# Patient Record
Sex: Male | Born: 1960 | Hispanic: No | Marital: Married | Smoking: Never smoker
Health system: Southern US, Community
[De-identification: ages and names within clinical notes are randomized; demographics above are authoritative.]

## PROBLEM LIST (undated history)

## (undated) DIAGNOSIS — E119 Type 2 diabetes mellitus without complications: Secondary | ICD-10-CM

## (undated) DIAGNOSIS — T7840XA Allergy, unspecified, initial encounter: Secondary | ICD-10-CM

## (undated) DIAGNOSIS — I10 Essential (primary) hypertension: Secondary | ICD-10-CM

## (undated) DIAGNOSIS — E785 Hyperlipidemia, unspecified: Secondary | ICD-10-CM

## (undated) HISTORY — DX: Essential (primary) hypertension: I10

## (undated) HISTORY — DX: Allergy, unspecified, initial encounter: T78.40XA

## (undated) HISTORY — DX: Type 2 diabetes mellitus without complications: E11.9

## (undated) HISTORY — DX: Hyperlipidemia, unspecified: E78.5

---

## 2007-06-24 ENCOUNTER — Emergency Department (HOSPITAL_COMMUNITY): Admission: EM | Admit: 2007-06-24 | Discharge: 2007-06-24 | Payer: Self-pay | Admitting: Emergency Medicine

## 2015-05-16 ENCOUNTER — Encounter: Payer: Self-pay | Admitting: Osteopathic Medicine

## 2015-05-16 ENCOUNTER — Ambulatory Visit (INDEPENDENT_AMBULATORY_CARE_PROVIDER_SITE_OTHER): Payer: BC Managed Care – PPO | Admitting: Osteopathic Medicine

## 2015-05-16 VITALS — BP 137/97 | HR 68 | Wt 147.0 lb

## 2015-05-16 DIAGNOSIS — Z1159 Encounter for screening for other viral diseases: Secondary | ICD-10-CM

## 2015-05-16 DIAGNOSIS — E119 Type 2 diabetes mellitus without complications: Secondary | ICD-10-CM | POA: Diagnosis not present

## 2015-05-16 DIAGNOSIS — Z79899 Other long term (current) drug therapy: Secondary | ICD-10-CM

## 2015-05-16 DIAGNOSIS — R5383 Other fatigue: Secondary | ICD-10-CM | POA: Diagnosis not present

## 2015-05-16 DIAGNOSIS — Z114 Encounter for screening for human immunodeficiency virus [HIV]: Secondary | ICD-10-CM

## 2015-05-16 DIAGNOSIS — E785 Hyperlipidemia, unspecified: Secondary | ICD-10-CM | POA: Insufficient documentation

## 2015-05-16 DIAGNOSIS — D751 Secondary polycythemia: Secondary | ICD-10-CM | POA: Diagnosis not present

## 2015-05-16 DIAGNOSIS — N529 Male erectile dysfunction, unspecified: Secondary | ICD-10-CM | POA: Insufficient documentation

## 2015-05-16 DIAGNOSIS — I10 Essential (primary) hypertension: Secondary | ICD-10-CM

## 2015-05-16 DIAGNOSIS — E559 Vitamin D deficiency, unspecified: Secondary | ICD-10-CM

## 2015-05-16 LAB — POCT GLYCOSYLATED HEMOGLOBIN (HGB A1C): HEMOGLOBIN A1C: 6.8

## 2015-05-16 LAB — CBC WITH DIFFERENTIAL/PLATELET
BASOS PCT: 1 % (ref 0–1)
Basophils Absolute: 0.1 10*3/uL (ref 0.0–0.1)
EOS ABS: 0.2 10*3/uL (ref 0.0–0.7)
EOS PCT: 3 % (ref 0–5)
HCT: 47.6 % (ref 39.0–52.0)
HEMOGLOBIN: 16.3 g/dL (ref 13.0–17.0)
Lymphocytes Relative: 37 % (ref 12–46)
Lymphs Abs: 2.9 10*3/uL (ref 0.7–4.0)
MCH: 30.9 pg (ref 26.0–34.0)
MCHC: 34.2 g/dL (ref 30.0–36.0)
MCV: 90.2 fL (ref 78.0–100.0)
MONO ABS: 0.8 10*3/uL (ref 0.1–1.0)
MONOS PCT: 10 % (ref 3–12)
MPV: 10.6 fL (ref 8.6–12.4)
NEUTROS ABS: 3.8 10*3/uL (ref 1.7–7.7)
Neutrophils Relative %: 49 % (ref 43–77)
Platelets: 297 10*3/uL (ref 150–400)
RBC: 5.28 MIL/uL (ref 4.22–5.81)
RDW: 14.1 % (ref 11.5–15.5)
WBC: 7.8 10*3/uL (ref 4.0–10.5)

## 2015-05-16 LAB — TSH: TSH: 1.397 u[IU]/mL (ref 0.350–4.500)

## 2015-05-16 LAB — COMPLETE METABOLIC PANEL WITH GFR
ALBUMIN: 4.5 g/dL (ref 3.6–5.1)
ALK PHOS: 75 U/L (ref 40–115)
ALT: 27 U/L (ref 9–46)
AST: 21 U/L (ref 10–35)
BUN: 10 mg/dL (ref 7–25)
CHLORIDE: 100 mmol/L (ref 98–110)
CO2: 29 mmol/L (ref 20–31)
Calcium: 9.9 mg/dL (ref 8.6–10.3)
Creat: 0.74 mg/dL (ref 0.70–1.33)
GFR, Est African American: >89 mL/min (ref >=60)
GLUCOSE: 123 mg/dL — AB (ref 65–99)
POTASSIUM: 5.2 mmol/L (ref 3.5–5.3)
SODIUM: 137 mmol/L (ref 135–146)
Total Bilirubin: 0.6 mg/dL (ref 0.2–1.2)
Total Protein: 8 g/dL (ref 6.1–8.1)

## 2015-05-16 LAB — LIPID PANEL
Cholesterol: 168 mg/dL (ref 125–200)
HDL: 49 mg/dL (ref >=40)
LDL CALC: 66 mg/dL (ref <130)
TRIGLYCERIDES: 264 mg/dL — AB (ref <150)
Total CHOL/HDL Ratio: 3.4 Ratio (ref <=5.0)
VLDL: 53 mg/dL — ABNORMAL HIGH (ref <30)

## 2015-05-16 MED ORDER — ASPIRIN EC 81 MG PO TBEC: 81.0000 mg | DELAYED_RELEASE_TABLET | Freq: Every day | ORAL | Status: DC

## 2015-05-16 MED ORDER — LISINOPRIL 5 MG PO TABS: 5.0000 mg | ORAL_TABLET | Freq: Every day | ORAL | Status: DC

## 2015-05-16 MED ORDER — METFORMIN HCL ER (MOD) 1000 MG PO TB24
1000.0000 mg | ORAL_TABLET | Freq: Every day | ORAL | Status: DC
Start: 2015-05-16 — End: 2015-05-25

## 2015-05-16 MED ORDER — TADALAFIL 10 MG PO TABS: 10.0000 mg | ORAL_TABLET | Freq: Every day | ORAL | Status: DC | PRN

## 2015-05-16 NOTE — Patient Instructions (Signed)
Medicines you need to be taking: Metformin 1000 mg twice per day, you can finish the 500 mg pills you have Aspirin 81 mg daily Crestor daily - we may need to increase the dose, we will wait for the blood test  Vitamin D once per week Lisinopril 5 mg daily - for blood pressure  You will need to come to the office for a blood pressure check with a nurse in one week.   You will need to come back for a doctor appointment in 3 months. At that visit, we will do a foot exam, make sure you have all your vaccines/shots needed.   We will call you about the blood test results this week.   Thank you!

## 2015-05-16 NOTE — Progress Notes (Signed)
HPI: Nicholas Salazar is a 54 y.o. male who presents to [place]  today for chief complaint of:  Chief Complaint  Patient presents with  . Establish Care    CHOLESTEROL - says had high cholesterol once but the n was ok, is DM2 so on statin. Had a checkup in [place] 01/2015 and Crestor was reduced, he states bloodwork was done there and normal.   DM2 - see below for preventive care. Pt on Metformin. A1C today. Only takes Metformin once per day.   FATIGUE - feels like he is sleeping too much, has been going on 2 - 3 months. Tired overall. No daytime somnolence.   HTN - patient reports the bottom on his blood pressure routinely over 90 when he checks this, no chest pain pressure palpitation, no shortness of breath or exercise tolerance changes in  Care Everywhere records show should be on ASA 81, Metformin 500 bid though he only takes this daily, Zantac 150 bid not taking, Crestor 20 daily but he is taking 5 mg daily at instruction of [place] doctor, Viagra 100mg  prn not taking because too expensice   Past medical, social and family history reviewed: No past medical history on file. No past surgical history on file. Social History  Substance Use Topics  . Smoking status: Not on file  . Smokeless tobacco: Not on file  . Alcohol Use: Not on file   No family history on file.  Current Outpatient Prescriptions  Medication Sig Dispense Refill  . metFORMIN (GLUCOPHAGE) 500 MG tablet Take 500 mg by mouth.    . rosuvastatin (CRESTOR) 5 MG tablet Take 5 mg by mouth daily.    . Vitamin D, Ergocalciferol, (DRISDOL) 50000 UNITS CAPS capsule Take by mouth.     No current facility-administered medications for this visit.   Allergies not on file    Review of Systems: CONSTITUTIONAL: Neg fever/chills, no unintentional weight changes, (+) fatigue/weakness HEAD/EYES/EARS/NOSE/THROAT: No headache/vision change or hearing change, no sore throat CARDIAC: No chest  pain/pressure/palpitations, no orthopnea RESPIRATORY: No cough/shortness of breath/wheeze GASTROINTESTINAL: No nausea/vomiting/abdominal pain/blood in stool/diarrhea/constipation MUSCULOSKELETAL: No myalgia/arthralgia GENITOURINARY: No incontinence, No abnormal genital bleeding/discharge SKIN: No rash/wounds/concerning lesions HEM/ONC: No easy bruising/bleeding, no abnormal lymph node ENDOCRINE: No polyuria/polydipsia/polyphagia, no heat/cold intolerance  NEUROLOGIC: No weakness/dizzines/slurred speech PSYCHIATRIC: No concerns with anxiety, (+) depression/sleep problems    Exam:  BP 137/97 mmHg  Pulse 68  Wt 147 lb (66.679 kg)  SpO2 99% Constitutional: VSS, see above. General Appearance: alert, well-developed, well-nourished, NAD Eyes: Normal lids and conjunctive, non-icteric sclera, PERRLA Ears, Nose, Mouth, Throat: Normal external inspection ears/nares/mouth/lips/gums, Normal TM bilaterally, MMM, posterior pharynx without erythema/exudate Neck: No masses, trachea midline. No thyroid enlargement/tenderness/mass appreciated Respiratory: Normal respiratory effort. no wheeze/rhonchi/rales Cardiovascular: S1/S2 normal, no murmur/rub/gallop auscultated. RRR. No carotid bruit or JVD. No abdominal aortic bruit. Pedal pulse II/IV bilaterally. No lower extremity edema. Gastrointestinal: Nontender, no masses. No hepatomegaly, no splenomegaly. No hernia appreciated. Bowel sounds normal. Rectal exam deferred.  Musculoskeletal: Gait normal. No clubbing/cyanosis of digits.  Neurological: No cranial nerve deficit on limited exam. Motor and sensation intact and symmetric Psychiatric: Normal judgment/insight. Normal mood and affect. Oriented x3.    Results for orders placed or performed in visit on 05/16/15 (from the past 72 hour(s))  POCT HgB A1C     Status: None   Collection Time: 05/16/15  9:40 AM  Result Value Ref Range   Hemoglobin A1C 6.8     Care Everywhere reviewed: pertinent info  as  follows Labs 09/2014 A1C 6.6 VitD 18 GFR WNL Urine Microalb WNL Hgb 16.9 LDL 32 Records indicate need pneumovax   ASSESSMENT/PLAN:  Diabetes mellitus without complication ([place]) - Plan: POCT HgB A1C, metFORMIN (GLUMETZA) 1000 MG (MOD) 24 hr tablet, aspirin EC 81 MG tablet  Hyperlipidemia - Plan: Lipid panel  Other fatigue - Plan: CBC with Differential/Platelet, COMPLETE METABOLIC PANEL WITH GFR, TSH  Polycythemia - Plan: CBC with Differential/Platelet  Essential hypertension - Plan: lisinopril (PRINIVIL,ZESTRIL) 5 MG tablet  Vitamin D deficiency - Plan: Vitamin D (25 hydroxy)  Encounter for screening for HIV - Plan: HIV antibody  Need for hepatitis C screening test - Plan: Hepatitis C antibody  Erectile dysfunction, unspecified erectile dysfunction type - savings card and printed Rx given for Cialis  Patient advised medication changes as follows: Needs to be on daily aspirin for heart health and ASCVD risk reduction. Needs to be taking metformin twice a day but for feeding and will switch thousand milligrams extended release, recheck A1c in 3 months. Continue Crestor 5 mg for now I advised may need to increase the dose of labwork. Patient is educated that main purpose of statin is to be on an appropriate dose to reduce ASCVD risk, he is currently on low dose and may need to go up on this. Not sure what the guidelines are in [place]. Start lisinopril low-dose for blood pressure control and diabetic preventive care.  DIABETES SCREENING/PREVENTIVE CARE: A1C past 3-6 mos: Yes  controlled? Yes  BP goal <140/90: No - started ACEI today  LDL goal <70: Yes  Eye exam annually: No, importance discussed with patient Foot exam: No  Microalbuminuria:WNL Metformin: Yes  ACE/ARB: No  Antiplatelet if ASCVD Risk >10%: No, started this today Statin: Yes but low dose  Next visit: foot exam, update vaccines, A1C

## 2015-05-17 LAB — VITAMIN D 25 HYDROXY (VIT D DEFICIENCY, FRACTURES): Vit D, 25-Hydroxy: 45 ng/mL (ref 30–100)

## 2015-05-17 LAB — HEPATITIS C ANTIBODY: HCV AB: NEGATIVE

## 2015-05-17 LAB — HIV ANTIBODY (ROUTINE TESTING W REFLEX): HIV 1&2 Ab, 4th Generation: NONREACTIVE

## 2015-05-17 MED ORDER — ROSUVASTATIN CALCIUM 20 MG PO TABS: 20.0000 mg | ORAL_TABLET | Freq: Every day | ORAL | Status: DC

## 2015-05-17 NOTE — Addendum Note (Signed)
Addended by: Maryla Morrow on: 05/17/2015 08:19 AM   Modules accepted: Orders

## 2015-05-25 ENCOUNTER — Other Ambulatory Visit: Payer: Self-pay | Admitting: *Deleted

## 2015-05-25 MED ORDER — METFORMIN HCL ER (OSM) 1000 MG PO TB24: 1000.0000 mg | ORAL_TABLET | Freq: Every day | ORAL | Status: DC

## 2015-07-04 ENCOUNTER — Telehealth: Payer: Self-pay

## 2015-07-04 DIAGNOSIS — I10 Essential (primary) hypertension: Secondary | ICD-10-CM

## 2015-07-04 NOTE — Telephone Encounter (Signed)
My last progress note states that he was only taking metformin 500 mg daily was prescribed for twice a day. If he is keeping track of his blood sugars) 500 twice a day with better, can cut the 1000 and off and do this, please advise him to bring his sugar readings to all of his doctor visits.

## 2015-07-04 NOTE — Telephone Encounter (Signed)
Patient states his blood sugar was controlled better with metformin 500 mg bid instead of metformin 1000 mg once daily. Please advise.

## 2015-07-04 NOTE — Telephone Encounter (Signed)
Left detailed message advising of recommendations.  

## 2015-08-02 NOTE — Telephone Encounter (Signed)
Please advise pt regarding current dosage of metformin.

## 2015-08-04 ENCOUNTER — Ambulatory Visit (INDEPENDENT_AMBULATORY_CARE_PROVIDER_SITE_OTHER): Payer: BC Managed Care – PPO | Admitting: Osteopathic Medicine

## 2015-08-04 ENCOUNTER — Encounter: Payer: Self-pay | Admitting: Osteopathic Medicine

## 2015-08-04 VITALS — BP 142/90 | HR 76 | Ht 67.0 in | Wt 146.0 lb

## 2015-08-04 DIAGNOSIS — I10 Essential (primary) hypertension: Secondary | ICD-10-CM | POA: Diagnosis not present

## 2015-08-04 DIAGNOSIS — E119 Type 2 diabetes mellitus without complications: Secondary | ICD-10-CM | POA: Diagnosis not present

## 2015-08-04 MED ORDER — METFORMIN HCL 500 MG PO TABS: 500.0000 mg | ORAL_TABLET | Freq: Every day | ORAL | Status: DC

## 2015-08-04 NOTE — Patient Instructions (Signed)
KEEP CHECKING YOUR SUGARS EVERY MORNING. YOU CAN TAKE THE METFORMIN 1000 MG IN THE MORNING AND 500 MG AT NIGHT, IF THIS IS BRINGING YOUR SUGARS DOWN, GOOD, IF NOT WE MAY NEED TO GO UP TO METFORMIN 1000 MG IN THE MORNINGS AND AT NIGHT TIME. PLEASE KEEP YOUR NEXT APPOINTMENT AND WE WILL CHECK YOUR LABS TO SEE HOW THE SUGARS ARE DOING OVERALL. PLEASE THINK ABOUT IF YOU WANT TO START A MEDICINE FOR ANXIETY AND WE WILL TALK ABOUT THAT MORE AT YOUR NEXT VISIT. PLEASE CALL THE OFFICE IF YOU HAVE ANY QUESTIONS!

## 2015-08-04 NOTE — Progress Notes (Signed)
HPI: Nicholas Salazar is a 54 y.o. male who presents to [place] today for chief complaint of:  Chief Complaint  Patient presents with  . Follow-up    labs    . DIABETES: Patient is concerned about his sugars going up, he brings sugar logs today. Fasting sugar was 188, 133, 139. Patient reports that he is nervous particularly about the 188 fasting sugar. He is taking metformin extended release 1000 mg daily, I had previously advised that he may need regular metformin 1000 twice a day however he has been resistant to this. He states that when he was on 500 mg twice a day history years were doing better. He asks if he can continue the metformin ER 1000 in the morning and take the 500s at night, I said this is reasonable but will need to see what the A1c is looking like next month. Patient is resistant to adding another oral medication to his regimen that he is motivated to make the necessary diet and exercise efforts  . ANXIETY: Patient reports that he is anxious about his blood sugars, his wife is also ill and passed away earlier this year and he is still dealing with some stress from medical bills. Patient denies feelings of sadness, guilt or worthlessness, he does report some difficulty sleeping and difficulty concentrating.  HYPERTENSION: Patient brings home blood pressure readings with him, diastolics typically in 123XX123, systolic typically in 123XX123 to 120s. Patient states he is not taking the lisinopril. Blood pressure elevated in the office today    Past medical, social and family history reviewed: Past Medical History  Diagnosis Date  . Diabetes mellitus ([place])   . Hyperlipidemia    No past surgical history on file. Social History  Substance Use Topics  . Smoking status: Never Smoker   . Smokeless tobacco: Not on file  . Alcohol Use: 0.6 oz/week    1 Standard drinks or equivalent per week   No family history on file.  Current Outpatient Prescriptions   Medication Sig Dispense Refill  . aspirin EC 81 MG tablet Take 1 tablet (81 mg total) by mouth daily. 90 tablet 3  . metformin (FORTAMET) 1000 MG (OSM) 24 hr tablet Take 1 tablet (1,000 mg total) by mouth daily with breakfast. 90 tablet 3  . rosuvastatin (CRESTOR) 20 MG tablet Take 1 tablet (20 mg total) by mouth daily. 90 tablet 3  . tadalafil (CIALIS) 10 MG tablet Take 1 tablet (10 mg total) by mouth daily as needed for erectile dysfunction. 30 tablet 1  . Vitamin D, Ergocalciferol, (DRISDOL) 50000 UNITS CAPS capsule Take by mouth.    Marland Kitchen lisinopril (PRINIVIL,ZESTRIL) 5 MG tablet Take 1 tablet (5 mg total) by mouth daily. (Patient not taking: Reported on 08/04/2015) 90 tablet 3   No current facility-administered medications for this visit.   No Known Allergies    Review of Systems: CONSTITUTIONAL:  No  fever, no chills, No  unintentional weight changes CARDIAC: No  chest pain, No  pressure RESPIRATORY: No  cough, No  shortness of breath/wheeze GASTROINTESTINAL: No  nausea, No  vomiting, No  abdominal pain, No  blood in stool, No  diarrhea, No  constipation  ENDOCRINE: No  polyuria/polydipsia/polyphagia, No  heat/cold intolerance  NEUROLOGIC: No  weakness, No  dizziness, No  slurred speech PSYCHIATRIC: No  concerns with depression, Yes  concerns with anxiety, No sleep problems     Exam:  BP 142/90 mmHg  Pulse 76  Ht 5'  7" (1.702 m)  Wt 146 lb (66.225 kg)  BMI 22.86 kg/m2 Constitutional: VS see above. General Appearance: alert, well-developed, well-nourished, NAD Psychiatric: Normal judgment/insight. Normal mood and affect. Oriented x3.    No results found for this or any previous visit (from the past 72 hour(s)).    ASSESSMENT/PLAN: Advised patient he is fine to take the metformin 1000 in morning and 500 at night, however if this is not effecting any improvement in his A1c we may need to increase the dose or think about adding a second oral agent. She was advised to bring his  home blood pressure cuff to his next visit so that we can verify his measurements since his blood pressure is a bit elevated in the office today compared to his home readings. Patient also reluctant to start any medication treatment for anxiety/depression, he says he will think about doing this and we can talk about it more and his next visit.  Diabetes mellitus without complication ([place])  Essential hypertension     Return if symptoms worsen or fail to improve. patient also instructed to keep his next appointment 08/19/2015

## 2015-08-19 ENCOUNTER — Ambulatory Visit (INDEPENDENT_AMBULATORY_CARE_PROVIDER_SITE_OTHER): Payer: BC Managed Care – PPO | Admitting: Osteopathic Medicine

## 2015-08-19 ENCOUNTER — Encounter: Payer: Self-pay | Admitting: Osteopathic Medicine

## 2015-08-19 VITALS — BP 131/84 | HR 77 | Ht 67.0 in | Wt 145.0 lb

## 2015-08-19 DIAGNOSIS — I10 Essential (primary) hypertension: Secondary | ICD-10-CM

## 2015-08-19 DIAGNOSIS — Z2821 Immunization not carried out because of patient refusal: Secondary | ICD-10-CM | POA: Diagnosis not present

## 2015-08-19 DIAGNOSIS — F411 Generalized anxiety disorder: Secondary | ICD-10-CM | POA: Insufficient documentation

## 2015-08-19 DIAGNOSIS — E119 Type 2 diabetes mellitus without complications: Secondary | ICD-10-CM

## 2015-08-19 DIAGNOSIS — Z79899 Other long term (current) drug therapy: Secondary | ICD-10-CM | POA: Diagnosis not present

## 2015-08-19 LAB — POCT GLYCOSYLATED HEMOGLOBIN (HGB A1C): HEMOGLOBIN A1C: 6.8

## 2015-08-19 NOTE — Progress Notes (Signed)
HPI: Nicholas Salazar is a 55 y.o. male who presents to [facility] today for chief complaint of:  Chief Complaint  Patient presents with  . Follow-up    diabetes    . DIABETES: Pt came in a few weeks ago concerned about his blood sugars going up. Fasting sugar was 188, 133, 139. He is taking metformin extended release 1000 mg daily, I had previously advised that he may need regular metformin 1000 twice a day however he has been resistant to this. He states that when he was on 500 mg twice a day history years were doing better. He asks if he can continue the metformin ER 1000 in the morning and take the 500s at night, I said this is reasonable. Patient is resistant to increasing metformin to standard dose but he is motivated to make the necessary diet and exercise efforts. A1C today is same.   Marland Kitchen ANXIETY: Patient also reluctant to start any medication treatment for anxiety/depression, he says he will think about doing this and we can talk about it more and his next visit.   HYPERTENSION: Patient brings home blood pressure readings with him, diastolics typically in 123XX123, systolic typically in 123XX123 to 120s. Patient states he is not taking the lisinopril. Blood pressure better in the office today than at last visit. He didn't bring his home BP cuff today.    DIABETES SCREENING/PREVENTIVE CARE: A1C past 3-6 mos: Yes  controlled? Yes though ideally would be at 6.5 given his lack of significant disease, was 6.8 on 05/16/15, same today BP goal <140/90: Yes  LDL goal <70: Yes  Eye exam annually: Yes , importance discussed with patient Foot exam: Yes  Microalbuminuria:No  Metformin: Yes  ACE/ARB: Prescribed but not taking Antiplatelet if ASCVD Risk >10%: Yes  Statin: Yes  Pneumovax: DECLINED Flu vaccine: DECLINED    Past medical, social and family history reviewed: Past Medical History  Diagnosis Date  . Diabetes mellitus ([place])   . Hyperlipidemia    No past  surgical history on file. Social History  Substance Use Topics  . Smoking status: Never Smoker   . Smokeless tobacco: Not on file  . Alcohol Use: 0.6 oz/week    1 Standard drinks or equivalent per week   No family history on file.  Current Outpatient Prescriptions  Medication Sig Dispense Refill  . aspirin EC 81 MG tablet Take 1 tablet (81 mg total) by mouth daily. 90 tablet 3  . lisinopril (PRINIVIL,ZESTRIL) 5 MG tablet Take 1 tablet (5 mg total) by mouth daily. (Patient not taking: Reported on 08/04/2015) 90 tablet 3  . metformin (FORTAMET) 1000 MG (OSM) 24 hr tablet Take 1 tablet (1,000 mg total) by mouth daily with breakfast. 90 tablet 3  . metFORMIN (GLUCOPHAGE) 500 MG tablet Take 1 tablet (500 mg total) by mouth at bedtime. 180 tablet 3  . rosuvastatin (CRESTOR) 20 MG tablet Take 1 tablet (20 mg total) by mouth daily. 90 tablet 3  . tadalafil (CIALIS) 10 MG tablet Take 1 tablet (10 mg total) by mouth daily as needed for erectile dysfunction. 30 tablet 1  . Vitamin D, Ergocalciferol, (DRISDOL) 50000 UNITS CAPS capsule Take by mouth.     No current facility-administered medications for this visit.   No Known Allergies    Review of Systems: CONSTITUTIONAL:  No  fever, no chills, No  unintentional weight changes CARDIAC: No  chest pain, No  pressure RESPIRATORY: No  cough, No  shortness of breath/wheeze  GASTROINTESTINAL: No  nausea, No  vomiting, No  abdominal pain, No  blood in stool, No  diarrhea, No  constipation  ENDOCRINE: No  polyuria/polydipsia/polyphagia, No  heat/cold intolerance  PSYCHIATRIC: No  concerns with depression, Yes  concerns with anxiety but doesn't want to be on medications, No sleep problems     Exam:  BP 131/84 mmHg  Pulse 77  Ht 5\' 7"  (1.702 m)  Wt 145 lb (65.772 kg)  BMI 22.71 kg/m2 Constitutional: VS see above. General Appearance: alert, well-developed, well-nourished, NAD Psychiatric: Normal judgment/insight. Normal mood and affect. Oriented x3.   Diabetic Foot Exam - Simple   Simple Foot Form  Diabetic Foot exam was performed with the following findings:  Yes 08/19/2015  9:39 AM  Visual Inspection  No deformities, no ulcerations, no other skin breakdown bilaterally:  Yes  Sensation Testing  Intact to touch and monofilament testing bilaterally:  Yes  Pulse Check  Posterior Tibialis and Dorsalis pulse intact bilaterally:  Yes  Comments        Results for orders placed or performed in visit on 08/19/15 (from the past 72 hour(s))  POCT HgB A1C     Status: None   Collection Time: 08/19/15  9:22 AM  Result Value Ref Range   Hemoglobin A1C 6.8       ASSESSMENT/PLAN: Caught up on foot exam today, pt declines flu and pneumonia vaccines. BP better on no meds.   Diabetes mellitus without complication ([place]) - Plan: POCT HgB A1C  Essential hypertension  Medication management  Pneumococcal vaccination declined  Influenza vaccination declined  Anxiety state   Return in about 3 months (around 11/17/2015) for DIABETES FOLLOW UP.

## 2015-11-04 ENCOUNTER — Telehealth: Payer: Self-pay

## 2015-11-04 DIAGNOSIS — E119 Type 2 diabetes mellitus without complications: Secondary | ICD-10-CM

## 2015-11-04 MED ORDER — METFORMIN HCL 500 MG PO TABS: 500.0000 mg | ORAL_TABLET | Freq: Every day | ORAL | Status: DC

## 2015-11-04 NOTE — Telephone Encounter (Signed)
Patient request refill for Metformin 500 mg. Rx was sent to [facility] and patient have a follow up scheduled for 04/17. Rhonda Cunningham,CMA

## 2015-11-18 ENCOUNTER — Ambulatory Visit (INDEPENDENT_AMBULATORY_CARE_PROVIDER_SITE_OTHER): Payer: BC Managed Care – PPO | Admitting: Osteopathic Medicine

## 2015-11-18 ENCOUNTER — Encounter: Payer: Self-pay | Admitting: Osteopathic Medicine

## 2015-11-18 VITALS — BP 125/85 | HR 63 | Ht 67.0 in | Wt 145.0 lb

## 2015-11-18 DIAGNOSIS — E119 Type 2 diabetes mellitus without complications: Secondary | ICD-10-CM

## 2015-11-18 DIAGNOSIS — S56912A Strain of unspecified muscles, fascia and tendons at forearm level, left arm, initial encounter: Secondary | ICD-10-CM

## 2015-11-18 DIAGNOSIS — M7712 Lateral epicondylitis, left elbow: Secondary | ICD-10-CM

## 2015-11-18 DIAGNOSIS — I10 Essential (primary) hypertension: Secondary | ICD-10-CM | POA: Diagnosis not present

## 2015-11-18 LAB — POCT GLYCOSYLATED HEMOGLOBIN (HGB A1C): HEMOGLOBIN A1C: 6.4

## 2015-11-18 MED ORDER — BLOOD GLUCOSE MONITOR KIT
PACK | Status: AC
Start: 2015-11-18 — End: ?

## 2015-11-18 NOTE — Patient Instructions (Signed)
For your arm pain, try the exercises and stretches outlined below to help with your arm pain, rest it for a few weeks, try the pain medicine that we gave you in the office, you can rub it directly on the skin where the pain is located. If you would like [place] to write a long-term prescription for this medicine, let us know and we will take care of that for you. If your pain gets worse or isn't going away, let us know and we can put in a referral to physical therapy or have you come back to the office to look at it again.  For your diabetes, keep up the good work with the medications and the exercise, your A1c is looking great today. Left plan to follow-up in 6 months to recheck the diabetes and complete your annual wellness exam. If he would like to come in a few days for that appointment, and get your lab work done ahead of time so we can talk about the results at your visit, that would be helpful for you.   If everything is going well, just be sure to keep your follow-up in 6 months. Otherwise, if anything is bothering you, please let us know sooner so that we can help!    RANGE OF MOTION (ROM) AND STRETCHING EXERCISES  These exercises may help you when beginning to rehabilitate your injury. Your symptoms may go away with or without further involvement from your physician, physical therapist, or athletic trainer. While completing these exercises, remember:   Restoring tissue flexibility helps normal motion to return to the joints. This allows healthier, less painful movement and activity.  An effective stretch should be held for at least 30 seconds.  A stretch should never be painful. You should only feel a gentle lengthening or release in the stretched tissue. RANGE OF MOTION - Wrist Flexion, Active-Assisted  Extend your right / left elbow with your fingers pointing down.*  Gently pull the back of your hand towards you, until you feel a gentle stretch on the top of your forearm.  Hold this  position for __________ seconds. Repeat __________ times. Complete this exercise __________ times per day.  *If directed by your physician, physical therapist or athletic trainer, complete this stretch with your elbow bent, rather than extended. RANGE OF MOTION - Wrist Extension, Active-Assisted  Extend your right / left elbow and turn your palm upwards.*  Gently pull your palm and fingertips back, so your wrist extends and your fingers point more toward the ground.  You should feel a gentle stretch on the inside of your forearm.  Hold this position for __________ seconds. Repeat __________ times. Complete this exercise __________ times per day. *If directed by your physician, physical therapist or athletic trainer, complete this stretch with your elbow bent, rather than extended. STRETCH - Wrist Flexion  Place the back of your right / left hand on a tabletop, leaving your elbow slightly bent. Your fingers should point away from your body.  Gently press the back of your hand down onto the table by straightening your elbow. You should feel a stretch on the top of your forearm.  Hold this position for __________ seconds. Repeat __________ times. Complete this stretch __________ times per day.  STRETCH - Wrist Extension   Place your right / left fingertips on a tabletop, leaving your elbow slightly bent. Your fingers should point backwards.  Gently press your fingers and palm down onto the table by straightening your elbow. You should  feel a stretch on the inside of your forearm.  Hold this position for __________ seconds. Repeat __________ times. Complete this stretch __________ times per day.  STRENGTHENING EXERCISES - Epicondylitis, Lateral (Tennis Elbow) These exercises may help you when beginning to rehabilitate your injury. They may resolve your symptoms with or without further involvement from your physician, physical therapist, or athletic trainer. While completing these  exercises, remember:   Muscles can gain both the endurance and the strength needed for everyday activities through controlled exercises.  Complete these exercises as instructed by your physician, physical therapist or athletic trainer. Increase the resistance and repetitions only as guided.  You may experience muscle soreness or fatigue, but the pain or discomfort you are trying to eliminate should never worsen during these exercises. If this pain does get worse, stop and make sure you are following the directions exactly. If the pain is still present after adjustments, discontinue the exercise until you can discuss the trouble with your caregiver. STRENGTH - Wrist Flexors  Sit with your right / left forearm palm-up and fully supported on a table or countertop. Your elbow should be resting below the height of your shoulder. Allow your wrist to extend over the edge of the surface.  Loosely holding a __________ weight, or a piece of rubber exercise band or tubing, slowly curl your hand up toward your forearm.  Hold this position for __________ seconds. Slowly lower the wrist back to the starting position in a controlled manner. Repeat __________ times. Complete this exercise __________ times per day.  STRENGTH - Wrist Extensors  Sit with your right / left forearm palm-down and fully supported on a table or countertop. Your elbow should be resting below the height of your shoulder. Allow your wrist to extend over the edge of the surface.  Loosely holding a __________ weight, or a piece of rubber exercise band or tubing, slowly curl your hand up toward your forearm.  Hold this position for __________ seconds. Slowly lower the wrist back to the starting position in a controlled manner. Repeat __________ times. Complete this exercise __________ times per day.  STRENGTH - Ulnar Deviators  Stand with a ____________________ weight in your right / left hand, or sit while holding a rubber exercise band  or tubing, with your healthy arm supported on a table or countertop.  Move your wrist, so that your pinkie travels toward your forearm and your thumb moves away from your forearm.  Hold this position for __________ seconds and then slowly lower the wrist back to the starting position. Repeat __________ times. Complete this exercise __________ times per day STRENGTH - Radial Deviators  Stand with a ____________________ weight in your right / left hand, or sit while holding a rubber exercise band or tubing, with your injured arm supported on a table or countertop.  Raise your hand upward in front of you or pull up on the rubber tubing.  Hold this position for __________ seconds and then slowly lower the wrist back to the starting position. Repeat __________ times. Complete this exercise __________ times per day. STRENGTH - Forearm Supinators   Sit with your right / left forearm supported on a table, keeping your elbow below shoulder height. Rest your hand over the edge, palm down.  Gently grip a hammer or a soup ladle.  Without moving your elbow, slowly turn your palm and hand upward to a "thumbs-up" position.  Hold this position for __________ seconds. Slowly return to the starting position. Repeat __________ times.  Complete this exercise __________ times per day.  STRENGTH - Forearm Pronators   Sit with your right / left forearm supported on a table, keeping your elbow below shoulder height. Rest your hand over the edge, palm up.  Gently grip a hammer or a soup ladle.  Without moving your elbow, slowly turn your palm and hand upward to a "thumbs-up" position.  Hold this position for __________ seconds. Slowly return to the starting position. Repeat __________ times. Complete this exercise __________ times per day.  STRENGTH - Grip  Grasp a tennis ball, a dense sponge, or a large, rolled sock in your hand.  Squeeze as hard as you can, without increasing any pain.  Hold this  position for __________ seconds. Release your grip slowly. Repeat __________ times. Complete this exercise __________ times per day.  STRENGTH - Elbow Extensors, Isometric  Stand or sit upright, on a firm surface. Place your right / left arm so that your palm faces your stomach, and it is at the height of your waist.  Place your opposite hand on the underside of your forearm. Gently push up as your right / left arm resists. Push as hard as you can with both arms, without causing any pain or movement at your right / left elbow. Hold this stationary position for __________ seconds. Gradually release the tension in both arms. Allow your muscles to relax completely before repeating.   This information is not intended to replace advice given to you by your health care provider. Make sure you discuss any questions you have with your health care provider.   Document Released: 07/30/2005 Document Revised: 08/20/2014 Document Reviewed: 11/11/2008 Elsevier Interactive Patient Education 2016 [employer]    Tendinitis  Tendinitis is redness, soreness, and puffiness (inflammation) of the tendons. Tendons are band-like tissues that connect muscle to bone. Tendinitis often happens in the shoulders, heels, or elbows. It might happen if your job involves doing the same motions over and over. HOME CARE  Use a sling or splint as told by your doctor.  Put ice on the injured area.  Put ice in a plastic bag.  Place a towel between your skin and the bag.  Leave the ice on for 15-20 minutes, 03-04 times a day.  Avoid using your injured arm or leg until the pain goes away.  Do gentle exercises only as told by your doctor. Stop exercises if the pain gets worse, unless your doctor tells you otherwise.  Only take medicines as told by your doctor. GET HELP RIGHT AWAY IF:  Your pain and puffiness get worse.  You have new problems, such as loss of feeling (numbness) in the hands. MAKE SURE  YOU:  Understand these instructions.  Will watch your condition.  Will get help right away if you are not doing well or get worse.   This information is not intended to replace advice given to you by your health care provider. Make sure you discuss any questions you have with your health care provider.   Document Released: 11/09/2010 Document Revised: 10/22/2011 Document Reviewed: 11/09/2010 Elsevier Interactive Patient Education [employer].

## 2015-11-18 NOTE — Progress Notes (Signed)
HPI: Nicholas Salazar is a 55 y.o. male who presents to [facility] today for chief complaint of:  Chief Complaint  Patient presents with  . Follow-up    DIABETES    . DIABETES: Taking the metformin ER 1000 in the morning and take the 500s at night, I said this is reasonable. Patient is resistant to increasing metformin to standard dose but he is motivated to make the necessary diet and exercise efforts. A1C today is same.    MUSCLE PAIN IN L FOREARM: Patient visited worse when he is lifting, located around distal to lateral epicondyle/brachioradialis muscle, worse with working out.    HYPERTENSION: Patient brings home blood pressure readings with him, diastolics typically in 40H, systolic typically in 680S to 120s. Patient states he is not taking the lisinopril. Blood pressure good in the office today than at last visit. He didn't bring his home BP cuff today - He doesn't have a home blood pressure cuff he says that he typically will measure it at [place]: A1C past 3-6 mos: Yes  controlled? Yes - Better at at 6.5 on 11/18/15 given his lack of significant comorbid disease, was 6.8 on 05/16/15, 6.8 on 08/19/15 BP goal <140/90: Yes  LDL goal <70: Yes  Eye exam annually: Yes , importance discussed with patient Foot exam: Yes  Microalbuminuria: No  Metformin: Yes  ACE/ARB: Prescribed but not taking Antiplatelet if ASCVD Risk >10%: Yes  Statin: Yes  Pneumovax: DECLINED Flu vaccine: DECLINED    Past medical, social and family history reviewed: Past Medical History  Diagnosis Date  . Diabetes mellitus ([place])   . Hyperlipidemia    No past surgical history on file. Social History  Substance Use Topics  . Smoking status: Never Smoker   . Smokeless tobacco: Not on file  . Alcohol Use: 0.6 oz/week    1 Standard drinks or equivalent per week   No family history on file.  Current Outpatient Prescriptions   Medication Sig Dispense Refill  . aspirin EC 81 MG tablet Take 1 tablet (81 mg total) by mouth daily. 90 tablet 3  . metformin (FORTAMET) 1000 MG (OSM) 24 hr tablet Take 1 tablet (1,000 mg total) by mouth daily with breakfast. 90 tablet 3  . metFORMIN (GLUCOPHAGE) 500 MG tablet Take 1 tablet (500 mg total) by mouth at bedtime. 180 tablet 3  . rosuvastatin (CRESTOR) 20 MG tablet Take 1 tablet (20 mg total) by mouth daily. 90 tablet 3  . tadalafil (CIALIS) 10 MG tablet Take 1 tablet (10 mg total) by mouth daily as needed for erectile dysfunction. 30 tablet 1   No current facility-administered medications for this visit.   No Known Allergies    Review of Systems: CONSTITUTIONAL:  No  fever, no chills, No  unintentional weight changes CARDIAC: No  chest pain, No  pressure RESPIRATORY: No  cough, No  shortness of breath/wheeze GASTROINTESTINAL: No  nausea, No  vomiting, No  abdominal pain, No  blood in stool, No  diarrhea, No  constipation  ENDOCRINE: No  polyuria/polydipsia/polyphagia, No  heat/cold intolerance  PSYCHIATRIC: No  concerns with depression, Yes  concerns with anxiety but doesn't want to be on medications, No sleep problems     Exam:  BP 125/85 mmHg  Pulse 63  Ht _0  (1.702 m)  Wt 145 lb (65.772 kg)  BMI 22.71 kg/m2 Constitutional: VS see above. General Appearance: alert, well-developed, well-nourished, NAD MUSCULOSKELETAL: mild tenderness L  lateral epicondyle and bracioradialis on extension of wrist CARDIOVASCULAR: S1-S2 normal, regular rate and rhythm, no murmurs rubs or gallops Respiratory: Lungs clear to auscultation bilaterally Psychiatric: Normal judgment/insight. Normal mood and affect. Oriented x3.     Results for orders placed or performed in visit on 11/18/15 (from the past 72 hour(s))  POCT HgB A1C     Status: None   Collection Time: 11/18/15  9:29 AM  Result Value Ref Range   Hemoglobin A1C 6.4   Improved from 6.8% three mos  ago    ASSESSMENT/PLAN: A1C improved. Pt declines flu and pneumonia vaccines. BP controlled on no meds - plan for Urine Microalbumin with recheck other annual screening labs 6 mos. Pennsaid samples for arm pain, stretching/rehab exercises given, advise trial prn OTC NSAID for severe pain.   Diabetes mellitus without complication ([place]) - Plan: POCT HgB A1C, blood glucose meter kit and supplies KIT  Essential hypertension  Lateral epicondylitis (tennis elbow), left  Muscle strain of forearm, left, initial encounter   Return in about 6 months (around 05/19/2016), or sooner if needed, for [place] .

## 2016-01-10 ENCOUNTER — Other Ambulatory Visit: Payer: Self-pay | Admitting: Osteopathic Medicine

## 2016-01-10 DIAGNOSIS — N529 Male erectile dysfunction, unspecified: Secondary | ICD-10-CM

## 2016-01-10 MED ORDER — TADALAFIL 10 MG PO TABS: 10.0000 mg | ORAL_TABLET | Freq: Every day | ORAL | Status: DC | PRN

## 2016-03-13 LAB — HM DIABETES EYE EXAM

## 2016-05-18 ENCOUNTER — Encounter: Payer: Self-pay | Admitting: Osteopathic Medicine

## 2016-05-18 ENCOUNTER — Ambulatory Visit (INDEPENDENT_AMBULATORY_CARE_PROVIDER_SITE_OTHER): Payer: BC Managed Care – PPO | Admitting: Osteopathic Medicine

## 2016-05-18 VITALS — BP 127/89 | HR 86 | Ht 67.0 in | Wt 146.0 lb

## 2016-05-18 DIAGNOSIS — K219 Gastro-esophageal reflux disease without esophagitis: Secondary | ICD-10-CM | POA: Diagnosis not present

## 2016-05-18 DIAGNOSIS — E119 Type 2 diabetes mellitus without complications: Secondary | ICD-10-CM | POA: Diagnosis not present

## 2016-05-18 DIAGNOSIS — Z0000 Encounter for general adult medical examination without abnormal findings: Secondary | ICD-10-CM

## 2016-05-18 LAB — COMPLETE METABOLIC PANEL WITH GFR
ALT: 23 U/L (ref 9–46)
AST: 20 U/L (ref 10–35)
Albumin: 4.3 g/dL (ref 3.6–5.1)
Alkaline Phosphatase: 72 U/L (ref 40–115)
BUN: 10 mg/dL (ref 7–25)
CALCIUM: 9.8 mg/dL (ref 8.6–10.3)
CHLORIDE: 99 mmol/L (ref 98–110)
CO2: 28 mmol/L (ref 20–31)
CREATININE: 0.93 mg/dL (ref 0.70–1.33)
GFR, Est African American: >89 mL/min (ref >=60)
GFR, Est Non African American: >89 mL/min (ref >=60)
GLUCOSE: 134 mg/dL — AB (ref 65–99)
POTASSIUM: 4.6 mmol/L (ref 3.5–5.3)
SODIUM: 136 mmol/L (ref 135–146)
Total Bilirubin: 0.9 mg/dL (ref 0.2–1.2)
Total Protein: 7.3 g/dL (ref 6.1–8.1)

## 2016-05-18 LAB — CBC WITH DIFFERENTIAL/PLATELET
BASOS PCT: 0 %
Basophils Absolute: 0 cells/uL (ref 0–200)
EOS PCT: 1 %
Eosinophils Absolute: 84 cells/uL (ref 15–500)
HEMATOCRIT: 49 % (ref 38.5–50.0)
Hemoglobin: 16.9 g/dL (ref 13.2–17.1)
LYMPHS ABS: 1680 {cells}/uL (ref 850–3900)
LYMPHS PCT: 20 %
MCH: 31.2 pg (ref 27.0–33.0)
MCHC: 34.5 g/dL (ref 32.0–36.0)
MCV: 90.6 fL (ref 80.0–100.0)
MONO ABS: 672 {cells}/uL (ref 200–950)
MPV: 9.6 fL (ref 7.5–12.5)
Monocytes Relative: 8 %
Neutro Abs: 5964 cells/uL (ref 1500–7800)
Neutrophils Relative %: 71 %
Platelets: 284 10*3/uL (ref 140–400)
RBC: 5.41 MIL/uL (ref 4.20–5.80)
RDW: 13.6 % (ref 11.0–15.0)
WBC: 8.4 10*3/uL (ref 3.8–10.8)

## 2016-05-18 LAB — LIPID PANEL
CHOL/HDL RATIO: 3.1 ratio (ref <=5.0)
CHOLESTEROL: 132 mg/dL (ref 125–200)
HDL: 43 mg/dL (ref >=40)
LDL CALC: 51 mg/dL (ref <130)
Triglycerides: 190 mg/dL — ABNORMAL HIGH (ref <150)
VLDL: 38 mg/dL — AB (ref <30)

## 2016-05-18 LAB — POCT GLYCOSYLATED HEMOGLOBIN (HGB A1C): HEMOGLOBIN A1C: 6.7

## 2016-05-18 LAB — POCT UA - MICROALBUMIN
Albumin/Creatinine Ratio, Urine, POC: <30
CREATININE, POC: 100 mg/dL
Microalbumin Ur, POC: 10 mg/L

## 2016-05-18 MED ORDER — RANITIDINE HCL 300 MG PO TABS: 300.0000 mg | ORAL_TABLET | Freq: Every day | ORAL | 1 refills | Status: DC

## 2016-05-18 NOTE — Progress Notes (Signed)
HPI: Nicholas Salazar is a 55 y.o. male  who presents to [facility] today, 05/18/16,  for chief complaint of:  Chief Complaint  Patient presents with  . Annual Exam     DIABETES SCREENING/PREVENTIVE CARE: A1C past 3-6 mos: Yes  controlled? Yes  BP goal <140/90: Yes  LDL goal <70: Yes  Eye exam annually: No , importance discussed with patient Foot exam: Yes  Microalbuminuria:No  Metformin: Yes  ACE/ARB: No  Antiplatelet if ASCVD Risk >10%: Yes  Statin: Yes   Complains of some GERD and requests refill on Ranitidine.   Preventive care reviewed as below.    Past medical, surgical, social and family history reviewed: Past Medical History:  Diagnosis Date  . Diabetes mellitus ([place])   . Hyperlipidemia    History reviewed. No pertinent surgical history. Social History  Substance Use Topics  . Smoking status: Never Smoker  . Smokeless tobacco: Never Used  . Alcohol use 0.6 oz/week    1 Standard drinks or equivalent per week   History reviewed. No pertinent family history.   Current medication list and allergy/intolerance information reviewed:   Current Outpatient Prescriptions  Medication Sig Dispense Refill  . aspirin EC 81 MG tablet Take 1 tablet (81 mg total) by mouth daily. 90 tablet 3  . blood glucose meter kit and supplies KIT Dispense based on patient and insurance preference. Use daily as directed. Please include lancets, test strips, control solution. 1 each 1  . metformin (FORTAMET) 1000 MG (OSM) 24 hr tablet Take 1 tablet (1,000 mg total) by mouth daily with breakfast. 90 tablet 3  . metFORMIN (GLUCOPHAGE) 500 MG tablet Take 1 tablet (500 mg total) by mouth at bedtime. 180 tablet 3  . rosuvastatin (CRESTOR) 20 MG tablet Take 1 tablet (20 mg total) by mouth daily. 90 tablet 3   No current facility-administered medications for this visit.    No Known Allergies    Review of Systems:  Constitutional:  No  fever, no chills, No  recent illness  HEENT: No  headache, no vision change  Cardiac: No  chest pain, No  pressure  Respiratory:  No  shortness of breath.  Gastrointestinal: No  abdominal pain, No  nausea,   Musculoskeletal: No new myalgia/arthralgia  Skin: No  Rash,   Endocrine: No polyuria/polydipsia/polyphagia   Neurologic: No  weakness, No  dizziness  Psychiatric: No  concerns with depression, No  concerns with anxiety  Exam:  BP 127/89   Pulse 86   Ht 5' 7"  (1.702 m)   Wt 146 lb (66.2 kg)   BMI 22.87 kg/m   Constitutional: VS see above. General Appearance: alert, well-developed, well-nourished, NAD  Eyes: Normal lids and conjunctive, non-icteric sclera  Ears, Nose, Mouth, Throat: MMM, Normal external inspection ears/nares/mouth/lips/gums. TM normal bilaterally. Pharynx/tonsils no erythema, no exudate. Nasal mucosa normal.   Neck: No masses, trachea midline. No thyroid enlargement. No tenderness/mass appreciated. No lymphadenopathy  Respiratory: Normal respiratory effort. no wheeze, no rhonchi, no rales  Cardiovascular: S1/S2 normal, no murmur, no rub/gallop auscultated. RRR. No lower extremity edema.   Gastrointestinal: Nontender, no masses. No hepatomegaly, no splenomegaly. No hernia appreciated. Bowel sounds normal. Rectal exam deferred.   Musculoskeletal: Gait normal. No clubbing/cyanosis of digits.   Neurological: Normal balance/coordination. No tremor.   Skin: warm, dry, intact. No rash/ulcer.  Psychiatric: Normal judgment/insight. Normal mood and affect. Oriented x3.    Results for orders placed or performed in visit on 05/18/16 (from the past  72 hour(s))  POCT UA - Microalbumin     Status: Normal   Collection Time: 05/18/16  9:42 AM  Result Value Ref Range   Microalbumin Ur, POC 10 mg/L   Creatinine, POC 100 mg/dL   Albumin/Creatinine Ratio, Urine, POC <30   POCT HgB A1C     Status: None   Collection Time: 05/18/16  9:42 AM  Result Value Ref Range   Hemoglobin A1C  6.7       ASSESSMENT/PLAN:   Annual physical exam - Plan: CBC with Differential/Platelet, COMPLETE METABOLIC PANEL WITH GFR, Lipid panel  Diabetes mellitus without complication (HCC) - Plan: POCT UA - Microalbumin, POCT HgB A1C, CBC with Differential/Platelet, COMPLETE METABOLIC PANEL WITH GFR, Lipid panel  Gastroesophageal reflux disease, esophagitis presence not specified - Plan: ranitidine (ZANTAC) 300 MG tablet     Patient Instructions  Recommended diabetes care:  Vaccines - annual Flu shot, Pneumonia shot once before age 85  Eye exam every year  Foot exam every year  A1C levels 7 or under  Annual labs to check urine, cholesterol, blood counts   Work on diet and exercise and plan to come back for A1C recheck in 3 months. If it's going up, we will need to increase your dose Metformin.    MALE PREVENTIVE CARE updated 05/18/16  ANNUAL SCREENING/COUNSELING  Any changes to health in the past year? yes  Tobacco - no never  Alcohol - none  Diet/Exercise - Healthy habits discussed to decrease CV risk and promote overall health. Patient does not have dietary restrictions. Walking 30 minutes few times per week. In [place] he is a bit more sedentary.   Depression - PQH2 Negative  Feel safe at home? - yes  HTN SCREENING - SEE [place]  Sexually active? - No  STI testing needed/desired today? - no  Any concerns with testosterone/libido? - no  INFECTIOUS DISEASE SCREENING  HIV - does not need  GC/CT - does not need  HepC - does not need  TB - does not need  CANCER SCREENING  Lung - does not need  Colon - does not need  Prostate - does not need  OTHER DISEASE SCREENING  Lipid - needs  DM2 - needs  Osteoporosis - does not need  ADULT VACCINATION  Influenza - needs today, annual vaccine recommended  Td - already has  Zoster - was not indicated  Pneumonia - was offered and declined by the patient    Visit summary  with medication list and pertinent instructions was printed for patient to review. All questions at time of visit were answered - patient instructed to contact office with any additional concerns. ER/RTC precautions were reviewed with the patient. Follow-up plan: Return in about 3 months (around 08/18/2016) for DIABETES FOLLOW-UP, sooner if needed.

## 2016-05-18 NOTE — Patient Instructions (Signed)
Recommended diabetes care:  Vaccines - annual Flu shot, Pneumonia shot once before age 56  Eye exam every year  Foot exam every year  A1C levels 7 or under  Annual labs to check urine, cholesterol, blood counts   Work on diet and exercise and plan to come back for A1C recheck in 3 months. If it's going up, we will need to increase your dose Metformin.

## 2016-06-04 ENCOUNTER — Other Ambulatory Visit: Payer: Self-pay | Admitting: Osteopathic Medicine

## 2016-08-08 ENCOUNTER — Ambulatory Visit (INDEPENDENT_AMBULATORY_CARE_PROVIDER_SITE_OTHER): Payer: BC Managed Care – PPO | Admitting: Osteopathic Medicine

## 2016-08-08 ENCOUNTER — Encounter: Payer: Self-pay | Admitting: Osteopathic Medicine

## 2016-08-08 VITALS — BP 140/89 | HR 94 | Ht 67.0 in | Wt 144.0 lb

## 2016-08-08 DIAGNOSIS — K219 Gastro-esophageal reflux disease without esophagitis: Secondary | ICD-10-CM | POA: Diagnosis not present

## 2016-08-08 DIAGNOSIS — R0981 Nasal congestion: Secondary | ICD-10-CM

## 2016-08-08 DIAGNOSIS — E785 Hyperlipidemia, unspecified: Secondary | ICD-10-CM

## 2016-08-08 DIAGNOSIS — E119 Type 2 diabetes mellitus without complications: Secondary | ICD-10-CM | POA: Diagnosis not present

## 2016-08-08 LAB — POCT GLYCOSYLATED HEMOGLOBIN (HGB A1C): Hemoglobin A1C: 6.9

## 2016-08-08 MED ORDER — ROSUVASTATIN CALCIUM 10 MG PO TABS: 20.0000 mg | ORAL_TABLET | Freq: Every day | ORAL | 3 refills | Status: DC

## 2016-08-08 NOTE — Progress Notes (Signed)
HPI: Nicholas Salazar is a 55 y.o. male  who presents to [facility] today, 08/08/16,  for chief complaint of:  Chief Complaint  Patient presents with  . Follow-up    DIABETES    Diabetes: doing well with current medications, no polyuria/plated. No home blood sugars to report.  GERD: Doing fine on ranitidine  Hyperlipidemia: Patient currently on Crestor, requests decreased dose of this medication if possible. Check list right have been on the high side, LDL was 51 T months ago when checked for his annual physical  Nasal congestion: Has been having some sinus congestion and intermittent dry cough for the past few weeks since eating back from [place]. He was therefore others funeral. No productive cough, no night sweats/fever   Past medical, surgical, social and family history reviewed: Patient Active Problem List   Diagnosis Date Noted  . Pneumococcal vaccination declined 08/19/2015  . Influenza vaccination declined 08/19/2015  . Anxiety state 08/19/2015  . Diabetes mellitus without complication ([place]) 15/72/6203  . Hyperlipidemia 05/16/2015  . Other fatigue 05/16/2015  . Polycythemia 05/16/2015  . Vitamin D deficiency 05/16/2015  . Erectile dysfunction 05/16/2015  . Essential hypertension 05/16/2015   No past surgical history on file. Social History  Substance Use Topics  . Smoking status: Never Smoker  . Smokeless tobacco: Never Used  . Alcohol use 0.6 oz/week    1 Standard drinks or equivalent per week   No family history on file.   Current medication list and allergy/intolerance information reviewed:   Current Outpatient Prescriptions on File Prior to Visit  Medication Sig Dispense Refill  . aspirin EC 81 MG tablet Take 1 tablet (81 mg total) by mouth daily. 90 tablet 3  . blood glucose meter kit and supplies KIT Dispense based on patient and insurance preference. Use daily as directed. Please include lancets, test strips, control solution.  1 each 1  . metformin (FORTAMET) 1000 MG (OSM) 24 hr tablet Take 1 tablet (1,000 mg total) by mouth daily with breakfast. 90 tablet 3  . ranitidine (ZANTAC) 300 MG tablet Take 1 tablet (300 mg total) by mouth at bedtime. 90 tablet 1  . rosuvastatin (CRESTOR) 20 MG tablet TAKE ONE TABLET BY MOUTH ONCE DAILY 90 tablet 3   No current facility-administered medications on file prior to visit.    No Known Allergies    Review of Systems:  Constitutional: No recent illness  HEENT: No  headache, no vision change  Cardiac: No  chest pain, No  pressure, No palpitations  Respiratory:  No  shortness of breath. No  Cough  Gastrointestinal: No  abdominal pain, no change on bowel habits  Musculoskeletal: No new myalgia/arthralgia  Skin: No  Rash  Hem/Onc: No  easy bruising/bleeding, No  abnormal lumps/bumps  Neurologic: No  weakness, No  Dizziness  Psychiatric: No  concerns with depression, No  concerns with anxiety  Exam:  BP 140/89   Pulse 94   Ht 5' 7"  (1.702 m)   Wt 144 lb (65.3 kg)   BMI 22.55 kg/m   Constitutional: VS see above. General Appearance: alert, well-developed, well-nourished, NAD  Eyes: Normal lids and conjunctive, non-icteric sclera  Ears, Nose, Mouth, Throat: MMM, Normal external inspection ears/nares/mouth/lips/gums.  Neck: No masses, trachea midline.   Respiratory: Normal respiratory effort. no wheeze, no rhonchi, no rales  Cardiovascular: S1/S2 normal, no murmur, no rub/gallop auscultated. RRR.   Musculoskeletal: Gait normal. Symmetric and independent movement of all extremities  Neurological: Normal balance/coordination. No  tremor.  Skin: warm, dry, intact.   Psychiatric: Normal judgment/insight. Normal mood and affect. Oriented x3.    Results for orders placed or performed in visit on 08/08/16 (from the past 72 hour(s))  POCT HgB A1C     Status: None   Collection Time: 08/08/16  2:45 PM  Result Value Ref Range   Hemoglobin A1C 6.9        ASSESSMENT/PLAN:    Diabetes mellitus without complication ([place]) - Plan: POCT HgB A1C, Lipid panel  Hyperlipidemia, unspecified hyperlipidemia type  Gastroesophageal reflux disease, esophagitis presence not specified  Nasal congestion    Patient Instructions  For nasal congestion: try Flonase nasal spray, can add Claritin or Allegra or Zyrtec over-the-counter antihistamines. (Can use the generic medicines)   For diabetes: doing great! Continue current medications, watch diet/exercise.   For cholesterol: we are lowering the dose of your medicine, and we will recheck the levels in 3 months if you'd like to get the blood work done before our visit, you can just go down tot he lab 2 -3 days before your appointment.     Visit summary with medication list and pertinent instructions was printed for patient to review. All questions at time of visit were answered - patient instructed to contact office with any additional concerns. ER/RTC precautions were reviewed with the patient. Follow-up plan: Return in about 3 months (around 11/06/2016) for RECHECK DIABETES AND CHOLESTEROL .

## 2016-08-08 NOTE — Patient Instructions (Signed)
For nasal congestion: try Flonase nasal spray, can add Claritin or Allegra or Zyrtec over-the-counter antihistamines. (Can use the generic medicines)   For diabetes: doing great! Continue current medications, watch diet/exercise.   For cholesterol: we are lowering the dose of your medicine, and we will recheck the levels in 3 months if you'd like to get the blood work done before our visit, you can just go down tot he lab 2 -3 days before your appointment.

## 2016-08-09 DIAGNOSIS — K219 Gastro-esophageal reflux disease without esophagitis: Secondary | ICD-10-CM | POA: Insufficient documentation

## 2016-08-09 MED ORDER — ROSUVASTATIN CALCIUM 10 MG PO TABS: 10.0000 mg | ORAL_TABLET | Freq: Every day | ORAL | 3 refills | Status: DC

## 2016-08-24 ENCOUNTER — Ambulatory Visit: Payer: BC Managed Care – PPO | Admitting: Osteopathic Medicine

## 2016-09-04 ENCOUNTER — Telehealth: Payer: Self-pay

## 2016-09-04 DIAGNOSIS — E119 Type 2 diabetes mellitus without complications: Secondary | ICD-10-CM

## 2016-09-04 NOTE — Telephone Encounter (Signed)
Patient request referral for Nutritionist in [place] with [address], phone # [phone] Fax# [phone]. Nicholas Salazar,CMA

## 2016-11-12 ENCOUNTER — Encounter: Payer: Self-pay | Admitting: Osteopathic Medicine

## 2016-11-12 ENCOUNTER — Ambulatory Visit (INDEPENDENT_AMBULATORY_CARE_PROVIDER_SITE_OTHER): Payer: BC Managed Care – PPO | Admitting: Osteopathic Medicine

## 2016-11-12 VITALS — BP 138/87 | HR 77 | Ht 67.0 in | Wt 145.0 lb

## 2016-11-12 DIAGNOSIS — I10 Essential (primary) hypertension: Secondary | ICD-10-CM | POA: Diagnosis not present

## 2016-11-12 DIAGNOSIS — Z23 Encounter for immunization: Secondary | ICD-10-CM

## 2016-11-12 DIAGNOSIS — G8929 Other chronic pain: Secondary | ICD-10-CM

## 2016-11-12 DIAGNOSIS — E119 Type 2 diabetes mellitus without complications: Secondary | ICD-10-CM

## 2016-11-12 DIAGNOSIS — K219 Gastro-esophageal reflux disease without esophagitis: Secondary | ICD-10-CM

## 2016-11-12 DIAGNOSIS — E785 Hyperlipidemia, unspecified: Secondary | ICD-10-CM | POA: Diagnosis not present

## 2016-11-12 DIAGNOSIS — M25562 Pain in left knee: Secondary | ICD-10-CM

## 2016-11-12 DIAGNOSIS — F411 Generalized anxiety disorder: Secondary | ICD-10-CM | POA: Diagnosis not present

## 2016-11-12 LAB — LIPID PANEL
Cholesterol: 122 mg/dL (ref <200)
HDL: 44 mg/dL (ref >40)
LDL CALC: 62 mg/dL (ref <100)
Total CHOL/HDL Ratio: 2.8 Ratio (ref <5.0)
Triglycerides: 81 mg/dL (ref <150)
VLDL: 16 mg/dL (ref <30)

## 2016-11-12 LAB — COMPLETE METABOLIC PANEL WITH GFR
ALT: 20 U/L (ref 9–46)
AST: 19 U/L (ref 10–35)
Albumin: 4.3 g/dL (ref 3.6–5.1)
Alkaline Phosphatase: 74 U/L (ref 40–115)
BILIRUBIN TOTAL: 0.5 mg/dL (ref 0.2–1.2)
BUN: 12 mg/dL (ref 7–25)
CALCIUM: 9.3 mg/dL (ref 8.6–10.3)
CHLORIDE: 103 mmol/L (ref 98–110)
CO2: 26 mmol/L (ref 20–31)
CREATININE: 0.96 mg/dL (ref 0.70–1.33)
GFR, Est African American: >89 mL/min (ref >=60)
GFR, Est Non African American: 88 mL/min (ref >=60)
Glucose, Bld: 123 mg/dL — ABNORMAL HIGH (ref 65–99)
Potassium: 4.6 mmol/L (ref 3.5–5.3)
Sodium: 138 mmol/L (ref 135–146)
TOTAL PROTEIN: 7.2 g/dL (ref 6.1–8.1)

## 2016-11-12 LAB — POCT GLYCOSYLATED HEMOGLOBIN (HGB A1C): HEMOGLOBIN A1C: 6.7

## 2016-11-12 NOTE — Progress Notes (Signed)
HPI: Nicholas Salazar is a 56 y.o. male  who presents to [facility] today, 11/12/16,  for chief complaint of:  Chief Complaint  Patient presents with  . Follow-up    DIABETES    Diabetes: doing well with current medications, no polyuria/plated. No home blood sugars to report.  (+) Depression Screening: pt states not interested in medications or counseling at this time.  Hypertension: Has not taken medications this morning, blood pressure slightly elevated. No Chest pain, pressure, shortness of breath.  GERD: Doing fine on ranitidine.  Hyperlipidemia: Patient currently on Crestor, requested decreased dose of this medication at last visit. Due for recheck levels.   Nasal congestion: At last visit was advised to try Flonase nasal spray, can add Claritin or Allegra or Zyrtec over-the-counter antihistamines. Doing well with these meds as needed.   L knee pain: on occasion will bother him. He saw a Dr about this in [place] and was given home therapy to perform, has kept up with this for th emost part, not too bothersome for him, no swelling, no falling.    Past medical, surgical, social and family history reviewed: Patient Active Problem List   Diagnosis Date Noted  . GERD (gastroesophageal reflux disease) 08/09/2016  . Pneumococcal vaccination declined 08/19/2015  . Influenza vaccination declined 08/19/2015  . Anxiety state 08/19/2015  . Diabetes mellitus without complication ([place]) 32/35/5732  . Hyperlipidemia 05/16/2015  . Other fatigue 05/16/2015  . Polycythemia 05/16/2015  . Vitamin D deficiency 05/16/2015  . Erectile dysfunction 05/16/2015  . Essential hypertension 05/16/2015   No past surgical history on file. Social History  Substance Use Topics  . Smoking status: Never Smoker  . Smokeless tobacco: Never Used  . Alcohol use 0.6 oz/week    1 Standard drinks or equivalent per week   No family history on file.   Current medication list and  allergy/intolerance information reviewed:   Current Outpatient Prescriptions on File Prior to Visit  Medication Sig Dispense Refill  . aspirin EC 81 MG tablet Take 1 tablet (81 mg total) by mouth daily. 90 tablet 3  . blood glucose meter kit and supplies KIT Dispense based on patient and insurance preference. Use daily as directed. Please include lancets, test strips, control solution. 1 each 1  . metformin (FORTAMET) 1000 MG (OSM) 24 hr tablet Take 1 tablet (1,000 mg total) by mouth daily with breakfast. 90 tablet 3  . ranitidine (ZANTAC) 300 MG tablet Take 1 tablet (300 mg total) by mouth at bedtime. 90 tablet 1  . rosuvastatin (CRESTOR) 10 MG tablet Take 1 tablet (10 mg total) by mouth daily. 90 tablet 3   No current facility-administered medications on file prior to visit.    No Known Allergies    Review of Systems:  Constitutional: No recent illness  HEENT: No  headache, no vision change  Cardiac: No  chest pain, No  pressure, No palpitations  Respiratory:  No  shortness of breath. No  Cough  Gastrointestinal: No  abdominal pain, no change on bowel habits  Musculoskeletal: No new myalgia/arthralgia, Occasional muscle cramping  Skin: No  Rash  Hem/Onc: No  easy bruising/bleeding, No  abnormal lumps/bumps  Neurologic: No  weakness, No  Dizziness  Psychiatric: No  concerns with depression, No  concerns with anxiety  Exam:  BP 138/87   Pulse 77   Ht 5' 7"  (1.702 m)   Wt 145 lb (65.8 kg)   BMI 22.71 kg/m  patient has not taken  medications this morning  Constitutional: VS see above. General Appearance: alert, well-developed, well-nourished, NAD  Eyes: Normal lids and conjunctive, non-icteric sclera  Ears, Nose, Mouth, Throat: MMM, Normal external inspection ears/nares/mouth/lips/gums.  Neck: No masses, trachea midline.   Respiratory: Normal respiratory effort. no wheeze, no rhonchi, no rales  Cardiovascular: S1/S2 normal, no murmur, no rub/gallop auscultated. RRR.    Musculoskeletal: Gait normal. Symmetric and independent movement of all extremities  Neurological: Normal balance/coordination. No tremor.  Skin: warm, dry, intact.   Psychiatric: Normal judgment/insight. Normal mood and affect. Oriented x3.      ASSESSMENT/PLAN:   A1c indicates good diabetes control. Foot exam and Pneumovax given today.  Diabetes mellitus without complication ([place]) - Plan: POCT HgB A1C, HM Diabetes Foot Exam, Pneumococcal polysaccharide vaccine 23-valent greater than or equal to 2yo subcutaneous/IM, COMPLETE METABOLIC PANEL WITH GFR  Essential hypertension - Plan: COMPLETE METABOLIC PANEL WITH GFR  Gastroesophageal reflux disease, esophagitis presence not specified  Anxiety state - Offered medications/counseling, patient declines for now.  Hyperlipidemia, unspecified hyperlipidemia type - Recheck cholesterol  Chronic pain of left knee - Likely arthritis, advised sports medicine follow-up as needed    Visit summary with medication list and pertinent instructions was printed for patient to review. All questions at time of visit were answered - patient instructed to contact office with any additional concerns. ER/RTC precautions were reviewed with the patient. Follow-up plan: Return in about 3 months (around 02/11/2017) for RECHECK DIABETES AND BLOOD PRESSURE .

## 2016-11-13 ENCOUNTER — Other Ambulatory Visit: Payer: Self-pay | Admitting: Osteopathic Medicine

## 2017-02-11 ENCOUNTER — Encounter: Payer: Self-pay | Admitting: Osteopathic Medicine

## 2017-02-11 ENCOUNTER — Ambulatory Visit (INDEPENDENT_AMBULATORY_CARE_PROVIDER_SITE_OTHER): Payer: BC Managed Care – PPO | Admitting: Osteopathic Medicine

## 2017-02-11 VITALS — BP 123/79 | HR 76 | Wt 139.0 lb

## 2017-02-11 DIAGNOSIS — Z1389 Encounter for screening for other disorder: Secondary | ICD-10-CM | POA: Diagnosis not present

## 2017-02-11 DIAGNOSIS — E119 Type 2 diabetes mellitus without complications: Secondary | ICD-10-CM

## 2017-02-11 DIAGNOSIS — K219 Gastro-esophageal reflux disease without esophagitis: Secondary | ICD-10-CM

## 2017-02-11 DIAGNOSIS — E785 Hyperlipidemia, unspecified: Secondary | ICD-10-CM | POA: Diagnosis not present

## 2017-02-11 DIAGNOSIS — I10 Essential (primary) hypertension: Secondary | ICD-10-CM | POA: Diagnosis not present

## 2017-02-11 DIAGNOSIS — Z1331 Encounter for screening for depression: Secondary | ICD-10-CM

## 2017-02-11 LAB — POCT GLYCOSYLATED HEMOGLOBIN (HGB A1C): HEMOGLOBIN A1C: 6.3

## 2017-02-11 NOTE — Patient Instructions (Addendum)
Plan:   Goal for top number blood pressure: 130 or less  Goal for bottom number blood pressure: 80 or less   We should think about starting a blood pressure medicine if your numbers continue to be high.

## 2017-02-11 NOTE — Progress Notes (Signed)
HPI: Nicholas Salazar is a 56 y.o. male  who presents to [facility] today, 02/11/17,  for chief complaint of:  Chief Complaint  Patient presents with  . Diabetes    Diabetes: doing well with current medications, no polyuria/polydipsia. No home blood sugars to report. A1C 11/2016 was 6.7%. A1C now as below: 6.3%.   Hypertension: No Chest pain, pressure, shortness of breath.Blood pressure has been slightly elevated on past few visits. Patient has been resistant to adding a medication, we talked about this a bit today, see assessment/plan section below  Hyperlipidemia: Patient currently on Crestor, requested decreased dose of this medication at last visit. Lipid check last time was good range    Past medical, surgical, social and family history reviewed: Patient Active Problem List   Diagnosis Date Noted  . GERD (gastroesophageal reflux disease) 08/09/2016  . Influenza vaccination declined 08/19/2015  . Anxiety state 08/19/2015  . Diabetes mellitus without complication ([place]) 83/04/4075  . Hyperlipidemia 05/16/2015  . Other fatigue 05/16/2015  . Polycythemia 05/16/2015  . Vitamin D deficiency 05/16/2015  . Erectile dysfunction 05/16/2015  . Essential hypertension 05/16/2015   No past surgical history on file. Social History  Substance Use Topics  . Smoking status: Never Smoker  . Smokeless tobacco: Never Used  . Alcohol use 0.6 oz/week    1 Standard drinks or equivalent per week   No family history on file.   Current medication list and allergy/intolerance information reviewed:   Current Outpatient Prescriptions on File Prior to Visit  Medication Sig Dispense Refill  . aspirin EC 81 MG tablet Take 1 tablet (81 mg total) by mouth daily. 90 tablet 3  . blood glucose meter kit and supplies KIT Dispense based on patient and insurance preference. Use daily as directed. Please include lancets, test strips, control solution. 1 each 1  . metformin  (FORTAMET) 1000 MG (OSM) 24 hr tablet Take 1 tablet (1,000 mg total) by mouth daily with breakfast. 90 tablet 3  . metFORMIN (GLUCOPHAGE) 1000 MG tablet TAKE ONE TABLET BY MOUTH ONCE DAILY WITH BREAKFAST 90 tablet 3  . ranitidine (ZANTAC) 300 MG tablet Take 1 tablet (300 mg total) by mouth at bedtime. 90 tablet 1  . rosuvastatin (CRESTOR) 10 MG tablet Take 1 tablet (10 mg total) by mouth daily. 90 tablet 3   No current facility-administered medications on file prior to visit.    Not on File    Review of Systems:  Constitutional: No recent illness  HEENT: No  headache, no vision change  Cardiac: No  chest pain, No  pressure, No palpitations  Respiratory:  No  shortness of breath. No  Cough  Gastrointestinal: No  abdominal pain  Musculoskeletal: No new myalgia/arthralgia, Neurologic: No  weakness, No  Dizziness  Psychiatric: No  concerns with depression, +concerns with anxiety  Exam:  BP 123/79   Pulse 76   Wt 139 lb (63 kg)   SpO2 100%   BMI 21.77 kg/m    Constitutional: VS see above. General Appearance: alert, well-developed, well-nourished, NAD  Eyes: Normal lids and conjunctive, non-icteric sclera  Ears, Nose, Mouth, Throat: MMM, Normal external inspection ears/nares/mouth/lips/gums.  Neck: No masses, trachea midline.   Respiratory: Normal respiratory effort. no wheeze, no rhonchi, no rales  Cardiovascular: S1/S2 normal, no murmur, no rub/gallop auscultated. RRR.   Musculoskeletal: Gait normal. Symmetric and independent movement of all extremities  Neurological: Normal balance/coordination. No tremor.  Skin: warm, dry, intact.   Psychiatric: Normal judgment/insight. Normal  mood and affect. Oriented x3.   Results for orders placed or performed in visit on 02/11/17 (from the past 24 hour(s))  POCT HgB A1C     Status: Abnormal   Collection Time: 02/11/17  9:35 AM  Result Value Ref Range   Hemoglobin A1C 6.3    Depression screen [facility] 2/9 02/11/2017 11/12/2016   Decreased Interest 1 2  Down, Depressed, Hopeless 1 2  PHQ - 2 Score 2 4  Altered sleeping 1 0  Tired, decreased energy 3 1  Change in appetite 0 0  Feeling bad or failure about yourself  1 1  Trouble concentrating 1 1  Moving slowly or fidgety/restless 0 0  Suicidal thoughts 0 1  PHQ-9 Score 8 8      ASSESSMENT/PLAN:   A1c indicates good diabetes control. Foot exam and Pneumovax given last visit.  Diabetes mellitus without complication ([place]) - Plan: POCT HgB A1C  Essential hypertension  Gastroesophageal reflux disease, esophagitis presence not specified  Hyperlipidemia, unspecified hyperlipidemia type  Positive depression screening - No SI, pt has been resistant to starting medications/counseling for this in the past. Advised to schedule separate visit to talk about this in detail    Patient Instructions  Plan:   Goal for top number blood pressure: 130 or less  Goal for bottom number blood pressure: 80 or less   We should think about starting a blood pressure medicine if your numbers continue to be high.        Visit summary with medication list and pertinent instructions was printed for patient to review. All questions at time of visit were answered - patient instructed to contact office with any additional concerns. ER/RTC precautions were reviewed with the patient. Follow-up plan: Return in about 3 months (around 05/14/2017) for recheck diabetes and blood pressure, sooner to discuss mood .

## 2017-02-25 ENCOUNTER — Encounter: Payer: Self-pay | Admitting: Osteopathic Medicine

## 2017-02-25 ENCOUNTER — Ambulatory Visit (INDEPENDENT_AMBULATORY_CARE_PROVIDER_SITE_OTHER): Payer: BC Managed Care – PPO | Admitting: Osteopathic Medicine

## 2017-02-25 VITALS — BP 125/70 | HR 69 | Wt 142.0 lb

## 2017-02-25 DIAGNOSIS — F341 Dysthymic disorder: Secondary | ICD-10-CM | POA: Diagnosis not present

## 2017-02-25 NOTE — Patient Instructions (Addendum)
Plan:  Referral to counseling  Let me know if any questions about medications, or if you are feeling worse

## 2017-02-25 NOTE — Progress Notes (Signed)
HPI: Nicholas Salazar is a 56 y.o. male  who presents to [facility] today, 02/25/17,  for chief complaint of:  Chief Complaint  Patient presents with  . mood    Mood problems - persistently positive PHQ screening. He reports sleeping too much, fatigue, feeling down >6 mos. Doesn't really interfere with day-to-day activities. He would rather avoid medications if possible.    Past medical history, surgical history, social history and family history reviewed.  Patient Active Problem List   Diagnosis Date Noted  . GERD (gastroesophageal reflux disease) 08/09/2016  . Influenza vaccination declined 08/19/2015  . Anxiety state 08/19/2015  . Diabetes mellitus without complication ([place]) 42/59/5638  . Hyperlipidemia 05/16/2015  . Other fatigue 05/16/2015  . Polycythemia 05/16/2015  . Vitamin D deficiency 05/16/2015  . Erectile dysfunction 05/16/2015  . Essential hypertension 05/16/2015    Current medication list and allergy/intolerance information reviewed.   Current Outpatient Prescriptions on File Prior to Visit  Medication Sig Dispense Refill  . aspirin EC 81 MG tablet Take 1 tablet (81 mg total) by mouth daily. 90 tablet 3  . blood glucose meter kit and supplies KIT Dispense based on patient and insurance preference. Use daily as directed. Please include lancets, test strips, control solution. 1 each 1  . metFORMIN (GLUCOPHAGE) 1000 MG tablet TAKE ONE TABLET BY MOUTH ONCE DAILY WITH BREAKFAST 90 tablet 3  . ranitidine (ZANTAC) 300 MG tablet Take 1 tablet (300 mg total) by mouth at bedtime. 90 tablet 1  . rosuvastatin (CRESTOR) 10 MG tablet Take 1 tablet (10 mg total) by mouth daily. 90 tablet 3   No current facility-administered medications on file prior to visit.    No Known Allergies    Review of Systems:  Constitutional: No recent illness  HEENT: No  headache  Respiratory:  No  shortness of breath  Neurologic: No  weakness, No   Dizziness  Psychiatric: +concerns with depression, No  concerns with anxiety, _sleep problems  Exam:  BP 125/70   Pulse 69   Wt 142 lb (64.4 kg)   SpO2 99%   BMI 22.24 kg/m   Constitutional: VS see above. General Appearance: alert, well-developed, well-nourished, NAD  Eyes: Normal lids and conjunctive, non-icteric sclera  Ears, Nose, Mouth, Throat: MMM, Normal external inspection ears/nares/mouth/lips/gums.  Neck: No masses, trachea midline.   Respiratory: Normal respiratory effort.  Musculoskeletal: Gait normal. Symmetric and independent movement of all extremities  Neurological: Normal balance/coordination. No tremor.  Skin: warm, dry, intact.   Psychiatric: Normal judgment/insight. Normal mood and affect. Oriented x3.    Recent Results (from the past 2160 hour(s))  POCT HgB A1C     Status: Abnormal   Collection Time: 02/11/17  9:35 AM  Result Value Ref Range   Hemoglobin A1C 6.3     Comment: %     GAD 7 : Generalized Anxiety Score 02/25/2017 02/11/2017  Nervous, Anxious, on Edge 0 1  Control/stop worrying 1 1  Worry too much - different things 0 1  Trouble relaxing 0 0  Restless 0 0  Easily annoyed or irritable 0 1  Afraid - awful might happen 0 0  Total GAD 7 Score 1 4    Depression screen [facility] 2/9 02/25/2017 02/11/2017 11/12/2016  Decreased Interest 1 1 2   Down, Depressed, Hopeless 0 1 2  PHQ - 2 Score 1 2 4   Altered sleeping 1 1 0  Tired, decreased energy 2 3 1   Change in appetite 0 0 0  Feeling bad or failure about yourself  1 1 1   Trouble concentrating 2 1 1   Moving slowly or fidgety/restless 0 0 0  Suicidal thoughts 0 0 1  PHQ-9 Score 7 8 8        ASSESSMENT/PLAN: Discussion of treatment options: meds, counseling, neither, waiting. He is amenable to counseling at this point d/t concerns for possible side effects of meds.   Dysthymia - Plan: Ambulatory referral to [facility]    Patient Instructions  Plan:  Referral to counseling  Let  me know if any questions about medications, or if you are feeling worse    Follow-up plan: Return for recheck diabetes 05/17/17 .  Visit summary with medication list and pertinent instructions was printed for patient to review, alert [place] if any changes needed. All questions at time of visit were answered - patient instructed to contact office with any additional concerns. ER/RTC precautions were reviewed with the patient and understanding verbalized.   Note: Total time spent 15 minutes, greater than 50% of the visit was spent face-to-face counseling and coordinating care for the following: The encounter diagnosis was Dysthymia.Marland Kitchen

## 2017-03-06 LAB — HM DIABETES EYE EXAM

## 2017-03-15 ENCOUNTER — Encounter: Payer: Self-pay | Admitting: Osteopathic Medicine

## 2017-05-13 ENCOUNTER — Ambulatory Visit (INDEPENDENT_AMBULATORY_CARE_PROVIDER_SITE_OTHER): Payer: BC Managed Care – PPO | Admitting: Osteopathic Medicine

## 2017-05-13 ENCOUNTER — Encounter: Payer: Self-pay | Admitting: Osteopathic Medicine

## 2017-05-13 VITALS — BP 138/91 | HR 65 | Ht 67.0 in | Wt 150.0 lb

## 2017-05-13 DIAGNOSIS — I10 Essential (primary) hypertension: Secondary | ICD-10-CM

## 2017-05-13 DIAGNOSIS — M25512 Pain in left shoulder: Secondary | ICD-10-CM

## 2017-05-13 DIAGNOSIS — G8929 Other chronic pain: Secondary | ICD-10-CM

## 2017-05-13 DIAGNOSIS — E119 Type 2 diabetes mellitus without complications: Secondary | ICD-10-CM

## 2017-05-13 LAB — POCT GLYCOSYLATED HEMOGLOBIN (HGB A1C): HEMOGLOBIN A1C: 6.8

## 2017-05-13 MED ORDER — DICLOFENAC SODIUM 1 % TD GEL: 2.0000 g | Freq: Four times a day (QID) | TRANSDERMAL | 3 refills | Status: DC

## 2017-05-13 NOTE — Progress Notes (Signed)
HPI: Nicholas Salazar is a 56 y.o. male  who presents to [facility] today, 05/13/17,  for chief complaint of:  Chief Complaint  Patient presents with  . Follow-up    DIABETES    Diabetes: doing well with current medications, no polyuria/polydipsia. No home blood sugars to report. A1C 11/2016 was 6.7%. A1C 02/2017 was 6.3%. Today 6.8% and some weight gain   Hypertension: No Chest pain, pressure, shortness of breath. Blood pressure slightly elevated. 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 GERD (gastroesophageal reflux disease) 08/09/2016  . Influenza vaccination declined 08/19/2015  . Anxiety state 08/19/2015  . Diabetes mellitus without complication ([place]) 29/92/4268  . Hyperlipidemia 05/16/2015  . Other fatigue 05/16/2015  . Polycythemia 05/16/2015  . Vitamin D deficiency 05/16/2015  . Erectile dysfunction 05/16/2015  . Essential hypertension 05/16/2015   No past surgical history on file. Social History  Substance Use Topics  . Smoking status: Never Smoker  . Smokeless tobacco: Never Used  . Alcohol use 0.6 oz/week    1 Standard drinks or equivalent per week   No family history on file.   Current medication list and allergy/intolerance information reviewed:   Current Outpatient Prescriptions on File Prior to Visit  Medication Sig Dispense Refill  . aspirin EC 81 MG tablet Take 1 tablet (81 mg total) by mouth daily. 90 tablet 3  . blood glucose meter kit and supplies KIT Dispense based on patient and insurance preference. Use daily as directed. Please include lancets, test strips, control solution. 1  each 1  . metFORMIN (GLUCOPHAGE) 1000 MG tablet TAKE ONE TABLET BY MOUTH ONCE DAILY WITH BREAKFAST 90 tablet 3  . rosuvastatin (CRESTOR) 10 MG tablet Take 1 tablet (10 mg total) by mouth daily. 90 tablet 3   No current facility-administered medications on file prior to visit.    No Known Allergies    Review of Systems:  Constitutional: No recent illness  HEENT: No  headache, no vision change  Cardiac: No  chest pain, No  pressure, No palpitations  Respiratory:  No  shortness of breath. 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 General Appearance: alert, well-developed, well-nourished, NAD  Eyes: Normal lids and conjunctive, non-icteric sclera  Ears, Nose, Mouth, Throat: MMM, Normal external inspection ears/nares/mouth/lips/gums.  Neck: No masses, trachea midline.   Respiratory: Normal respiratory effort. no wheeze, no rhonchi, no rales  Cardiovascular: S1/S2 normal, no murmur, no rub/gallop auscultated. RRR.   Musculoskeletal: Gait normal. Symmetric and independent movement of all extremities. Neg drop arm, (+)muscle spasm rhomboid area on L, neg apprehension and corss arm   Neurological: Normal balance/coordination. No tremor.  Skin: warm, dry, intact.   Psychiatric: Normal judgment/insight. Normal mood and affect. Oriented x3.  Results for orders placed or performed in visit on 05/13/17 (from the past 24 hour(s))  POCT HgB A1C     Status: None   Collection Time: 05/13/17 10:08 AM  Result Value Ref Range   Hemoglobin A1C 6.8        ASSESSMENT/PLAN:   A1c indicates ok diabetes control - could be better. Discussed  increase metformin but he doesn't want to do this or add BP meds.   Foot exam and Pneumovax given last visit.  Diabetes mellitus without complication ([place]) - Plan: POCT HgB A1C  Essential hypertension - declined additional medication again. Will watch closely   Chronic left shoulder pain - Plan: diclofenac sodium (VOLTAREN) 1 % GEL    Patient Instructions  If shoudler is not better or if it gets worse, I would recommend follow-up with one of our sports medicine specialists (Dr Georgina Snell or Dr. Patton Salles Dr. Darene Lamer) for further evaluation. Just call our office and ask for an appointment for sports medicine!        Visit summary with medication list and pertinent instructions was printed for patient to review. All questions at time of visit were answered - patient instructed to contact office with any additional concerns. ER/RTC precautions were reviewed with the patient. Follow-up plan: Return in about 3 months (around 08/13/2017) for recheck diabetes and blood pressure, sooner if needed .

## 2017-05-13 NOTE — Patient Instructions (Signed)
If shoudler is not better or if it gets worse, I would recommend follow-up with one of our sports medicine specialists (Dr Georgina Snell or Dr. Patton Salles Dr. Darene Lamer) for further evaluation. Just call our office and ask for an appointment for sports medicine!

## 2017-05-14 ENCOUNTER — Other Ambulatory Visit: Payer: Self-pay | Admitting: Osteopathic Medicine

## 2017-05-17 ENCOUNTER — Ambulatory Visit: Payer: BC Managed Care – PPO | Admitting: Osteopathic Medicine

## 2017-08-16 ENCOUNTER — Encounter: Payer: Self-pay | Admitting: Osteopathic Medicine

## 2017-08-16 ENCOUNTER — Ambulatory Visit: Payer: BC Managed Care – PPO | Admitting: Osteopathic Medicine

## 2017-08-16 VITALS — BP 132/97 | HR 78 | Wt 141.1 lb

## 2017-08-16 DIAGNOSIS — M62838 Other muscle spasm: Secondary | ICD-10-CM

## 2017-08-16 DIAGNOSIS — G8929 Other chronic pain: Secondary | ICD-10-CM

## 2017-08-16 DIAGNOSIS — K219 Gastro-esophageal reflux disease without esophagitis: Secondary | ICD-10-CM

## 2017-08-16 DIAGNOSIS — M25512 Pain in left shoulder: Secondary | ICD-10-CM | POA: Diagnosis not present

## 2017-08-16 DIAGNOSIS — I10 Essential (primary) hypertension: Secondary | ICD-10-CM | POA: Diagnosis not present

## 2017-08-16 DIAGNOSIS — E119 Type 2 diabetes mellitus without complications: Secondary | ICD-10-CM

## 2017-08-16 LAB — COMPLETE METABOLIC PANEL WITH GFR
AG Ratio: 1.6 (calc) (ref 1.0–2.5)
ALBUMIN MSPROF: 4.7 g/dL (ref 3.6–5.1)
ALT: 24 U/L (ref 9–46)
AST: 24 U/L (ref 10–35)
Alkaline phosphatase (APISO): 87 U/L (ref 40–115)
BUN: 8 mg/dL (ref 7–25)
CALCIUM: 9.9 mg/dL (ref 8.6–10.3)
CO2: 31 mmol/L (ref 20–32)
CREATININE: 0.89 mg/dL (ref 0.70–1.33)
Chloride: 99 mmol/L (ref 98–110)
GFR, EST AFRICAN AMERICAN: 110 mL/min/{1.73_m2} (ref >=60)
GFR, EST NON AFRICAN AMERICAN: 95 mL/min/{1.73_m2} (ref >=60)
GLOBULIN: 2.9 g/dL (ref 1.9–3.7)
Glucose, Bld: 135 mg/dL — ABNORMAL HIGH (ref 65–99)
Potassium: 4.6 mmol/L (ref 3.5–5.3)
SODIUM: 137 mmol/L (ref 135–146)
TOTAL PROTEIN: 7.6 g/dL (ref 6.1–8.1)
Total Bilirubin: 1 mg/dL (ref 0.2–1.2)

## 2017-08-16 LAB — LIPID PANEL
CHOL/HDL RATIO: 2.8 (calc) (ref <5.0)
Cholesterol: 121 mg/dL (ref <200)
HDL: 44 mg/dL (ref >40)
LDL Cholesterol (Calc): 54 mg/dL (calc)
NON-HDL CHOLESTEROL (CALC): 77 mg/dL (ref <130)
Triglycerides: 152 mg/dL — ABNORMAL HIGH (ref <150)

## 2017-08-16 LAB — CBC
HEMATOCRIT: 48.3 % (ref 38.5–50.0)
HEMOGLOBIN: 16.4 g/dL (ref 13.2–17.1)
MCH: 30.6 pg (ref 27.0–33.0)
MCHC: 34 g/dL (ref 32.0–36.0)
MCV: 90.1 fL (ref 80.0–100.0)
MPV: 11.2 fL (ref 7.5–12.5)
Platelets: 333 10*3/uL (ref 140–400)
RBC: 5.36 10*6/uL (ref 4.20–5.80)
RDW: 12.7 % (ref 11.0–15.0)
WBC: 7.9 10*3/uL (ref 3.8–10.8)

## 2017-08-16 LAB — POCT GLYCOSYLATED HEMOGLOBIN (HGB A1C): Hemoglobin A1C: 6.6

## 2017-08-16 MED ORDER — METFORMIN HCL 1000 MG PO TABS: 1000.0000 mg | ORAL_TABLET | Freq: Every day | ORAL | 3 refills | Status: DC

## 2017-08-16 MED ORDER — RANITIDINE HCL 150 MG PO TABS: 150.0000 mg | ORAL_TABLET | Freq: Every day | ORAL | 1 refills | Status: DC

## 2017-08-16 MED ORDER — ROSUVASTATIN CALCIUM 10 MG PO TABS: 10.0000 mg | ORAL_TABLET | Freq: Every day | ORAL | 3 refills | Status: DC

## 2017-08-16 NOTE — Progress Notes (Signed)
HPI: Nicholas Salazar is a 57 y.o. male  who presents to [facility] today, 08/16/17,  for chief complaint of:  Chief Complaint  Patient presents with  . Diabetes    Diabetes: doing well with current medications, no polyuria/polydipsia. No home blood sugars to report.   A1C 11/2016 was 6.7%.   A1C 02/2017 was 6.3%. 05/13/17 was 6.8% and some weight gain was noted.   Today 08/16/17 is 6.6% and he is down 9 lbs since last visit.   Will be due for routine labs around 11/2017  Hypertension: No Chest pain, pressure, shortness of breath. Blood pressure diastolic persistently slightly elevated. Patient has been resistant to adding a medication, we revisited this today, see assessment/plan section below. Checking in [facility] or [facility], 110s over 80s per patient  GERD: occasionally taking liquid bsmuth, not taking the Rantidine regularly   Muscle spasm: occasional cramping, not sure if he needs vitamins or something else to help.  L should pain: stable, declined PT     Past medical, surgical, social and family history reviewed: Patient Active Problem List   Diagnosis Date Noted  . GERD (gastroesophageal reflux disease) 08/09/2016  . Influenza vaccination declined 08/19/2015  . Anxiety state 08/19/2015  . Diabetes mellitus without complication ([place]) 43/15/4008  . Hyperlipidemia 05/16/2015  . Other fatigue 05/16/2015  . Polycythemia 05/16/2015  . Vitamin D deficiency 05/16/2015  . Erectile dysfunction 05/16/2015  . Essential hypertension 05/16/2015   No past surgical history on file. Social History   Tobacco Use  . Smoking status: Never Smoker  . Smokeless tobacco: Never Used  Substance Use Topics  . Alcohol use: Yes    Alcohol/week: 0.6 oz    Types: 1 Standard drinks or equivalent per week   No family history on file.   Current medication list and allergy/intolerance information reviewed:   Current Outpatient Medications on File Prior  to Visit  Medication Sig Dispense Refill  . aspirin EC 81 MG tablet Take 1 tablet (81 mg total) by mouth daily. 90 tablet 3  . blood glucose meter kit and supplies KIT Dispense based on patient and insurance preference. Use daily as directed. Please include lancets, test strips, control solution. 1 each 1  . diclofenac sodium (VOLTAREN) 1 % GEL Apply 2-4 g topically 4 (four) times daily. 100 g 3  . metFORMIN (GLUCOPHAGE) 1000 MG tablet TAKE ONE TABLET BY MOUTH ONCE DAILY WITH BREAKFAST 90 tablet 3  . ONETOUCH DELICA LANCETS 67Y MISC USE ONE LANCTES TO CHECK BLOOD SUGAR ONCE DAILY AS DIRECTED 100 each 1  . rosuvastatin (CRESTOR) 10 MG tablet Take 1 tablet (10 mg total) by mouth daily. 90 tablet 3   No current facility-administered medications on file prior to visit.    No Known Allergies    Review of Systems:  Constitutional: No recent illness  HEENT: No  headache, no vision change  Cardiac: No  chest pain, No  pressure, No palpitations  Respiratory:  No  shortness of breath. 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 General Appearance: alert, well-developed, well-nourished, NAD  Eyes: Normal lids and conjunctive, non-icteric sclera  Ears, Nose, Mouth, Throat: MMM, Normal external inspection ears/nares/mouth/lips/gums.  Neck: No masses, trachea midline.   Respiratory: Normal respiratory effort. no wheeze, no rhonchi, no rales  Cardiovascular: S1/S2 normal, no murmur, no rub/gallop auscultated. RRR.   Musculoskeletal: Gait normal. Symmetric and independent movement of all extremities.   Neurological: Normal  balance/coordination. No tremor.  Skin: warm, dry, intact.   Psychiatric: Normal judgment/insight. Normal mood and affect. Oriented x3.   Results for orders placed or performed in visit on 08/16/17 (from the past 48 hour(s))  POCT HgB A1C     Status: None   Collection Time: 08/16/17  9:58 AM  Result Value Ref Range   Hemoglobin A1C 6.6   CBC     Status: None   Collection Time: 08/16/17 10:16 AM  Result Value Ref Range   WBC 7.9 3.8 - 10.8 Thousand/uL   RBC 5.36 4.20 - 5.80 Million/uL   Hemoglobin 16.4 13.2 - 17.1 g/dL   HCT 48.3 38.5 - 50.0 %   MCV 90.1 80.0 - 100.0 fL   MCH 30.6 27.0 - 33.0 pg   MCHC 34.0 32.0 - 36.0 g/dL   RDW 12.7 11.0 - 15.0 %   Platelets 333 140 - 400 Thousand/uL   MPV 11.2 7.5 - 12.5 fL  COMPLETE METABOLIC PANEL WITH GFR     Status: Abnormal   Collection Time: 08/16/17 10:16 AM  Result Value Ref Range   Glucose, Bld 135 (H) 65 - 99 mg/dL    Comment: .            Fasting reference interval . For someone without known diabetes, a glucose value >125 mg/dL indicates that they may have diabetes and this should be confirmed with a follow-up test. .    BUN 8 7 - 25 mg/dL   Creat 0.89 0.70 - 1.33 mg/dL    Comment: For patients >62 years of age, the reference limit for Creatinine is approximately 13% higher for people identified as African-American. .    GFR, Est Non African American 95 > OR = 60 mL/min/1.54m   GFR, Est African American 110 > OR = 60 mL/min/1.792m  BUN/Creatinine Ratio NOT APPLICABLE 6 - 22 (calc)   Sodium 137 135 - 146 mmol/L   Potassium 4.6 3.5 - 5.3 mmol/L   Chloride 99 98 - 110 mmol/L   CO2 31 20 - 32 mmol/L   Calcium 9.9 8.6 - 10.3 mg/dL   Total Protein 7.6 6.1 - 8.1 g/dL   Albumin 4.7 3.6 - 5.1 g/dL   Globulin 2.9 1.9 - 3.7 g/dL (calc)   AG Ratio 1.6 1.0 - 2.5 (calc)   Total Bilirubin 1.0 0.2 - 1.2 mg/dL   Alkaline phosphatase (APISO) 87 40 - 115 U/L   AST 24 10 - 35 U/L   ALT 24 9 - 46 U/L  Lipid panel     Status:  Abnormal   Collection Time: 08/16/17 10:16 AM  Result Value Ref Range   Cholesterol 121 <200 mg/dL   HDL 44 >40 mg/dL   Triglycerides 152 (H) <150 mg/dL   LDL Cholesterol (Calc) 54 mg/dL (calc)    Comment: Reference range: <100 . Desirable range <100 mg/dL for primary prevention;   <70 mg/dL for patients with CHD or diabetic patients  with > or = 2 CHD risk factors. . Marland KitchenDL-C is now calculated using the Martin-Hopkins  calculation, which is a validated novel method providing  better accuracy than  the Friedewald equation in the  estimation of LDL-C.  Cresenciano Genre et al. Annamaria Helling. 7921;783(75): 2061-2068  (http://education.[employer].com/faq/FAQ164)    Total CHOL/HDL Ratio 2.8 <5.0 (calc)   Non-HDL Cholesterol (Calc) 77 <130 mg/dL (calc)    Comment: For patients with diabetes plus 1 major ASCVD risk  factor, treating to a non-HDL-C goal of <100 mg/dL  (LDL-C of <70 mg/dL) is considered a therapeutic  option.        ASSESSMENT/PLAN:   Diabetes mellitus without complication ([place]) - Plan: POCT HgB A1C, Lipid panel  Essential hypertension - Plan: CBC, COMPLETE METABOLIC PANEL WITH GFR  Gastroesophageal reflux disease, esophagitis presence not specified  Muscle spasm - Plan: CBC, COMPLETE METABOLIC PANEL WITH GFR  Chronic left shoulder pain    Patient Instructions  Goal blood pressure:  Top number 130 or less Bottom number 80 or less   For heartburn:  Can use Ranitidine, I can send a prescription for this  Can use bismuth liquid as needed  For shoulder Let me know if you'd like to see a physical therapist       Visit summary with medication list and pertinent instructions was printed for patient to review. All questions at time of visit were answered - patient instructed to contact office with any additional concerns. ER/RTC precautions were reviewed with the patient. Follow-up plan: Return in about 4 months (around 12/14/2017) for ANNUAL PHYSICAL .

## 2017-08-16 NOTE — Patient Instructions (Addendum)
Goal blood pressure:  Top number 130 or less Bottom number 80 or less   For heartburn:  Can use Ranitidine, I can send a prescription for this  Can use bismuth liquid as needed  For shoulder Let me know if you'd like to see a physical therapist

## 2017-08-17 ENCOUNTER — Encounter: Payer: Self-pay | Admitting: Osteopathic Medicine

## 2017-12-13 ENCOUNTER — Ambulatory Visit (INDEPENDENT_AMBULATORY_CARE_PROVIDER_SITE_OTHER): Payer: BC Managed Care – PPO | Admitting: Osteopathic Medicine

## 2017-12-13 ENCOUNTER — Ambulatory Visit (INDEPENDENT_AMBULATORY_CARE_PROVIDER_SITE_OTHER): Payer: BC Managed Care – PPO

## 2017-12-13 ENCOUNTER — Encounter: Payer: Self-pay | Admitting: Osteopathic Medicine

## 2017-12-13 VITALS — BP 136/87 | HR 64 | Temp 98.1°F | Wt 143.9 lb

## 2017-12-13 DIAGNOSIS — M25562 Pain in left knee: Secondary | ICD-10-CM

## 2017-12-13 DIAGNOSIS — Z86010 Personal history of colonic polyps: Secondary | ICD-10-CM | POA: Diagnosis not present

## 2017-12-13 DIAGNOSIS — E119 Type 2 diabetes mellitus without complications: Secondary | ICD-10-CM | POA: Diagnosis not present

## 2017-12-13 DIAGNOSIS — M25512 Pain in left shoulder: Secondary | ICD-10-CM

## 2017-12-13 DIAGNOSIS — G8929 Other chronic pain: Secondary | ICD-10-CM

## 2017-12-13 DIAGNOSIS — M25712 Osteophyte, left shoulder: Secondary | ICD-10-CM | POA: Diagnosis not present

## 2017-12-13 DIAGNOSIS — Z0000 Encounter for general adult medical examination without abnormal findings: Secondary | ICD-10-CM

## 2017-12-13 LAB — POCT UA - MICROALBUMIN
CREATININE, POC: 100 mg/dL
Microalbumin Ur, POC: 10 mg/L

## 2017-12-13 LAB — POCT GLYCOSYLATED HEMOGLOBIN (HGB A1C): Hemoglobin A1C: 6.6

## 2017-12-13 MED ORDER — MELOXICAM 7.5 MG PO TABS: 7.5000 mg | ORAL_TABLET | Freq: Every day | ORAL | 3 refills | Status: DC

## 2017-12-13 NOTE — Progress Notes (Signed)
HPI: Nicholas Salazar is a 57 y.o. male who  has a past medical history of Diabetes mellitus ([place]) and Hyperlipidemia.  he presents to [facility] today, 12/13/17,  for chief complaint of: Annual physical Diabetes  Patient here for annual physical / wellness exam.  See preventive care reviewed as below.  Recent labs reviewed   Additional concerns today include:   A1C today is 6.6% he'd like to have metformin 500 mg to take bid rather than 100 mg to take AM.   Anxiety - stable, not interested in medications   Shoulder and knee pain still bothering him.    GAD 7 : Generalized Anxiety Score 12/13/2017 02/25/2017 02/11/2017  Nervous, Anxious, on Edge 1 0 1  Control/stop worrying _0 Worry too much - different things 1 0 1  Trouble relaxing 0 0 0  Restless 0 0 0  Easily annoyed or irritable 0 0 1  Afraid - awful might happen 1 0 0  Total GAD 7 Score _1 Anxiety Difficulty Somewhat difficult - -    Depression screen [facility] 2/9 12/13/2017 02/25/2017 02/11/2017  Decreased Interest _2 Down, Depressed, Hopeless 1 0 1  PHQ - 2 Score _3 Altered sleeping _4 Tired, decreased energy _5 Change in appetite 0 0 0  Feeling bad or failure about yourself  _6 Trouble concentrating _7 Moving slowly or fidgety/restless 0 0 0  Suicidal thoughts 1 0 0  PHQ-9 Score _8 Difficult doing work/chores Somewhat difficult - -       Past medical, surgical, social and family history reviewed:  Patient Active Problem List   Diagnosis Date Noted  . GERD (gastroesophageal reflux disease) 08/09/2016  . Influenza vaccination declined 08/19/2015  . Anxiety state 08/19/2015  . Diabetes mellitus without complication ([place]) 99/37/1696  . Hyperlipidemia 05/16/2015  . Other fatigue 05/16/2015  . Polycythemia 05/16/2015  . Vitamin D deficiency 05/16/2015  . Erectile dysfunction 05/16/2015  . Essential hypertension 05/16/2015    No past surgical  history on file.  Social History   Tobacco Use  . Smoking status: Never Smoker  . Smokeless tobacco: Never Used  Substance Use Topics  . Alcohol use: Yes    Alcohol/week: 0.6 oz    Types: 1 Standard drinks or equivalent per week   No family history on file.   Current medication list and allergy/intolerance information reviewed:    Current Outpatient Medications  Medication Sig Dispense Refill  . aspirin EC 81 MG tablet Take 1 tablet (81 mg total) by mouth daily. 90 tablet 3  . blood glucose meter kit and supplies KIT Dispense based on patient and insurance preference. Use daily as directed. Please include lancets, test strips, control solution. 1 each 1  . diclofenac sodium (VOLTAREN) 1 % GEL Apply 2-4 g topically 4 (four) times daily. 100 g 3  . metFORMIN (GLUCOPHAGE) 1000 MG tablet Take 1 tablet (1,000 mg total) by mouth daily with breakfast. 90 tablet 3  . ONETOUCH DELICA LANCETS 78L MISC USE ONE LANCTES TO CHECK BLOOD SUGAR ONCE DAILY AS DIRECTED 100 each 1  . ranitidine (ZANTAC) 150 MG tablet Take 1 tablet (150 mg total) by mouth at bedtime. 90 tablet 1  . rosuvastatin (CRESTOR) 10 MG tablet Take 1 tablet (10 mg total) by mouth daily. 90 tablet 3   No current facility-administered medications for  this visit.     No Known Allergies    Review of Systems:  Constitutional:  No  fever, no chills, No recent illness, No unintentional weight changes.   HEENT: No  headache, no vision change, no hearing change  Cardiac: No  chest pain, No  pressure, No palpitations,   Respiratory:  No  shortness of breath. No  Cough  Gastrointestinal: No  abdominal pain, No  nausea, No  vomiting,  No  blood in stool, No  diarrhea, No  constipation   Musculoskeletal: No new myalgia/arthralgia, knee and arm pain as above   Skin: No  Rash  Hem/Onc: No  easy bruising/bleeding  Endocrine: No polyuria/polydipsia/polyphagia   Neurologic: No  weakness, No  dizziness,   Psychiatric: No   concerns with depression, +concerns with anxiety, No sleep problems, No mood problems  Exam:  BP 136/87 (BP Location: Left Arm, Patient Position: Sitting, Cuff Size: Normal)   Pulse 64   Temp 98.1 F (36.7 C) (Oral)   Wt 143 lb 14.4 oz (65.3 kg)   BMI 22.54 kg/m   Constitutional: VS see above. General Appearance: alert, well-developed, well-nourished, NAD  Eyes: Normal lids and conjunctive, non-icteric sclera  Ears, Nose, Mouth, Throat: MMM, Normal external inspection ears/nares/mouth/lips/gums. TM normal bilaterally. Pharynx/tonsils no erythema, no exudate. Nasal mucosa normal.   Neck: No masses, trachea midline. No thyroid enlargement. No tenderness/mass appreciated. No lymphadenopathy  Respiratory: Normal respiratory effort. no wheeze, no rhonchi, no rales  Cardiovascular: S1/S2 normal, no murmur, no rub/gallop auscultated. RRR. No lower extremity edema.  Gastrointestinal: Nontender, no masses. No hepatomegaly, no splenomegaly. No hernia appreciated. Bowel sounds normal. Rectal exam deferred.   Musculoskeletal: Gait normal. No clubbing/cyanosis of digits.   Neurological: Normal balance/coordination. No tremor. No cranial nerve deficit on limited exam. Motor and sensation intact and symmetric. Cerebellar reflexes intact.   Skin: warm, dry, intact.   Psychiatric: Normal judgment/insight. Normal mood and affect. Oriented x3.    Results for orders placed or performed in visit on 12/13/17 (from the past 72 hour(s))  POCT HgB A1C     Status: None   Collection Time: 12/13/17  9:49 AM  Result Value Ref Range   Hemoglobin A1C 6.6       ASSESSMENT/PLAN:   Annual physical exam  Diabetes mellitus without complication ([place]) - Plan: POCT HgB A1C, POCT UA - Microalbumin  History of colon polyps - Plan: Ambulatory referral to Gastroenterology  Chronic left shoulder pain - Plan: DG Shoulder Left  Chronic pain of left knee - Plan: DG Knee Complete 4 Views Left    MALE  PREVENTIVE CARE  updated 12/13/17  ANNUAL SCREENING/COUNSELING  Any changes to health in the past year? no  Diet/Exercise - HEALTHY HABITS DISCUSSED TO DECREASE CV RISK Social History   Tobacco Use  Smoking Status Never Smoker  Smokeless Tobacco Never Used   Social History   Substance and Sexual Activity  Alcohol Use Yes  . Alcohol/week: 0.6 oz  . Types: 1 Standard drinks or equivalent per week    SEXUAL/REPRODUCTIVE HEALTH  STI testing needed/desired today? - no  Any concerns with testosterone/libido? - no  INFECTIOUS DISEASE SCREENING  HIV - does not need  GC/CT - does not need  HepC - does not need  TB - does not need  CANCER SCREENING  Lung - USPSTF: 55-80yo w/ 30 py hx unless quit w/in 78yr- does not need  Colon - needs, will think about this when he's back - due this  year    Prostate - does not need  OTHER DISEASE SCREENING  Lipid - does not need  DM2 - does not need  AAA - 65-75yo ever smoked: does not need  Osteoporosis - men 58yo+ - does not need  ADULT VACCINATION  Influenza - annual vaccine recommended  Td - booster every 10 years   Zoster - Shingrix recommended 21+ years old  PCV13 - was not indicated  PPSV23 - already has Immunization History  Administered Date(s) Administered  . Influenza-Unspecified 06/13/2014  . Pneumococcal Polysaccharide-23 11/12/2016    OTHER  Fall - exercise and Vit D age 68+ - does not need  Consider ASA - age 19-59 - does not need    Patient Instructions  Goal blood pressure:  Top number 130 or less  Bottom number 80 or less   When you come see me next, let's also get a visit the same day with sports medicine to look at the shoulder and the knee     Visit summary with medication list and pertinent instructions was printed for patient to review. All questions at time of visit were answered - patient instructed to contact office with any additional concerns. ER/RTC precautions were  reviewed with the patient.   Follow-up plan: Return in about 3 months (around 03/15/2018) for A1C / recheck diabetes PLUS visit with sports medicine for arm and knee pain, sooner if needed .    Please note: voice recognition software was used to produce this document, and typos may escape review. Please contact Dr. Sheppard Coil for any needed clarifications.

## 2017-12-13 NOTE — Patient Instructions (Addendum)
Goal blood pressure:  Top number 130 or less  Bottom number 80 or less   When you come see me next, let's also get a visit the same day with sports medicine to look at the shoulder and the knee

## 2018-02-17 ENCOUNTER — Encounter: Payer: Self-pay | Admitting: Osteopathic Medicine

## 2018-04-18 ENCOUNTER — Encounter: Payer: Self-pay | Admitting: Osteopathic Medicine

## 2018-04-18 ENCOUNTER — Ambulatory Visit (INDEPENDENT_AMBULATORY_CARE_PROVIDER_SITE_OTHER): Payer: BC Managed Care – PPO | Admitting: Osteopathic Medicine

## 2018-04-18 VITALS — BP 131/87 | HR 72 | Temp 98.0°F | Wt 140.0 lb

## 2018-04-18 DIAGNOSIS — G44219 Episodic tension-type headache, not intractable: Secondary | ICD-10-CM

## 2018-04-18 DIAGNOSIS — E119 Type 2 diabetes mellitus without complications: Secondary | ICD-10-CM | POA: Diagnosis not present

## 2018-04-18 LAB — POCT GLYCOSYLATED HEMOGLOBIN (HGB A1C): HEMOGLOBIN A1C: 7.1 % — AB (ref 4.0–5.6)

## 2018-04-18 NOTE — Patient Instructions (Signed)
Excedrin Migraine OTC - try this as needed for headaches If these get worse or change, come see me

## 2018-04-18 NOTE — Progress Notes (Signed)
HPI: Nicholas Salazar is a 57 y.o. male who  has a past medical history of Diabetes mellitus ([place]) and Hyperlipidemia.  he presents to [facility] today, 04/18/18,  for chief complaint of:  Diabetes follow up    Diabetes: doing well with current medications, no polyuria/polydipsia. No home blood sugars to report.  11/2016 was 6.7%.   02/2017 was 6.3%.   05/13/17 was 6.8% and some weight gain was noted.   08/16/17 was 6.6% and he is down 9 lbs since last visit.   12/13/17 was 6.6% preferred 500 mg bid rather than 1000 daily  04/18/18 is 7.1% taking 1000 in AM, hasn't been as good about diet/exercise while traveling   Wt Readings from Last 3 Encounters:  04/18/18 140 lb (63.5 kg)  12/13/17 143 lb 14.4 oz (65.3 kg)  08/16/17 141 lb 1.3 oz (64 kg)    Headache: Occasional headache top of head/frontal once in awhile which goes away on its own. No vision changes. No severe pain.      Past medical history, surgical history, and family history reviewed.  Current medication list and allergy/intolerance information reviewed.   (See remainder of HPI, ROS, Phys Exam below)    Results for orders placed or performed in visit on 04/18/18 (from the past 72 hour(s))  POCT HgB A1C     Status: Abnormal   Collection Time: 04/18/18 10:42 AM  Result Value Ref Range   Hemoglobin A1C 7.1 (A) 4.0 - 5.6 %   HbA1c POC (<> result, manual entry)     HbA1c, POC (prediabetic range)     HbA1c, POC (controlled diabetic range)          ASSESSMENT/PLAN:   Diabetes mellitus without complication ([place]) - if no better w/ lifestyle mods, will need to adjust medicines - Plan: POCT HgB A1C  Episodic tension-type headache, not intractable     Patient Instructions  Excedrin Migraine OTC - try this as needed for headaches If these get worse or change, come see me    Follow-up plan: Return in about 3 months (around 07/18/2018) for A1C recheck - sooner if needed  .                ############################################ ############################################ ############################################ ############################################    Outpatient Encounter Medications as of 04/18/2018  Medication Sig Note  . aspirin EC 81 MG tablet Take 1 tablet (81 mg total) by mouth daily.   . blood glucose meter kit and supplies KIT Dispense based on patient and insurance preference. Use daily as directed. Please include lancets, test strips, control solution.   . diclofenac sodium (VOLTAREN) 1 % GEL Apply 2-4 g topically 4 (four) times daily.   . meloxicam (MOBIC) 7.5 MG tablet Take 1-2 tablets (7.5-15 mg total) by mouth daily. For one week, then as-need for pain after that   . metFORMIN (GLUCOPHAGE) 1000 MG tablet Take 1 tablet (1,000 mg total) by mouth daily with breakfast.   . ONETOUCH DELICA LANCETS 25Q MISC USE ONE LANCTES TO CHECK BLOOD SUGAR ONCE DAILY AS DIRECTED 04/18/2018: As per pt, currently using [employer] lancets  . ranitidine (ZANTAC) 150 MG tablet Take 1 tablet (150 mg total) by mouth at bedtime.   . rosuvastatin (CRESTOR) 10 MG tablet Take 1 tablet (10 mg total) by mouth daily.    No facility-administered encounter medications on file as of 04/18/2018.    No Known Allergies    Review of Systems:  Constitutional: No recent illness  HEENT: +occasional headache, no vision change  Cardiac: No  chest pain, No  pressure, No palpitations  Respiratory:  No  shortness of breath. No  Cough  Gastrointestinal: No  abdominal pain, no change on bowel habits  Musculoskeletal: No new myalgia/arthralgia  Skin: No  Rash  Neurologic: No  weakness, No  Dizziness  Psychiatric: No  concerns with depression, No  concerns with anxiety  Exam:  BP 131/87 (BP Location: Left Arm, Patient Position: Sitting, Cuff Size: Normal)   Pulse 72   Temp 98 F (36.7 C) (Oral)   Wt 140 lb (63.5 kg)   BMI 21.93 kg/m    Constitutional: VS see above. General Appearance: alert, well-developed, well-nourished, NAD  Eyes: Normal lids and conjunctive, non-icteric sclera  Ears, Nose, Mouth, Throat: MMM, Normal external inspection ears/nares/mouth/lips/gums. EOMI, PERRL  Neck: No masses, trachea midline. Normal ROM   Respiratory: Normal respiratory effort. no wheeze, no rhonchi, no rales  Cardiovascular: S1/S2 normal, no murmur, no rub/gallop auscultated. RRR.   Musculoskeletal: Gait normal. Symmetric and independent movement of all extremities  Neurological: Normal balance/coordination. No tremor.  Skin: warm, dry, intact.   Psychiatric: Normal judgment/insight. Normal mood and affect. Oriented x3.   Visit summary with medication list and pertinent instructions was printed for patient to review, advised to alert [place] if any changes needed. All questions at time of visit were answered - patient instructed to contact office with any additional concerns. ER/RTC precautions were reviewed with the patient and understanding verbalized.   Follow-up plan: Return in about 3 months (around 07/18/2018) for A1C recheck - sooner if needed .    Please note: voice recognition software was used to produce this document, and typos may escape review. Please contact Dr. Sheppard Coil for any needed clarifications.

## 2018-08-15 ENCOUNTER — Encounter: Payer: Self-pay | Admitting: Family Medicine

## 2018-08-15 ENCOUNTER — Other Ambulatory Visit: Payer: Self-pay

## 2018-08-15 ENCOUNTER — Ambulatory Visit: Payer: BC Managed Care – PPO | Admitting: Osteopathic Medicine

## 2018-08-15 ENCOUNTER — Telehealth: Payer: Self-pay | Admitting: Osteopathic Medicine

## 2018-08-15 ENCOUNTER — Ambulatory Visit (INDEPENDENT_AMBULATORY_CARE_PROVIDER_SITE_OTHER): Payer: BC Managed Care – PPO | Admitting: Family Medicine

## 2018-08-15 ENCOUNTER — Ambulatory Visit (INDEPENDENT_AMBULATORY_CARE_PROVIDER_SITE_OTHER): Payer: BC Managed Care – PPO

## 2018-08-15 VITALS — BP 147/97 | HR 76 | Ht 67.0 in | Wt 149.0 lb

## 2018-08-15 DIAGNOSIS — S93402A Sprain of unspecified ligament of left ankle, initial encounter: Secondary | ICD-10-CM | POA: Diagnosis not present

## 2018-08-15 DIAGNOSIS — M25572 Pain in left ankle and joints of left foot: Secondary | ICD-10-CM

## 2018-08-15 DIAGNOSIS — I10 Essential (primary) hypertension: Secondary | ICD-10-CM | POA: Diagnosis not present

## 2018-08-15 MED ORDER — ROSUVASTATIN CALCIUM 10 MG PO TABS: 10.0000 mg | ORAL_TABLET | Freq: Every day | ORAL | 1 refills | Status: DC

## 2018-08-15 MED ORDER — LISINOPRIL 5 MG PO TABS: 5.0000 mg | ORAL_TABLET | Freq: Every day | ORAL | 1 refills | Status: DC

## 2018-08-15 MED ORDER — METFORMIN HCL 1000 MG PO TABS: 1000.0000 mg | ORAL_TABLET | Freq: Every day | ORAL | 1 refills | Status: DC

## 2018-08-15 NOTE — Telephone Encounter (Signed)
Task completed. Please inform pt that upcoming appt must be kept to avoid delays in refills.

## 2018-08-15 NOTE — Progress Notes (Signed)
Acute Office Visit  Subjective:    Patient ID: Nicholas Salazar, male    DOB: Oct 18, 1960, 58 y.o.   MRN: [id number]  Chief Complaint  Patient presents with  . Ankle Pain    L ankle x 2 wks he reports that he tripped in the parking lot. 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 he said that his ankle was really swollen and it has gone down he has been using a wrap,tiger balm,epsom salt soaks. 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  He has been applying wrap, tiger balm and Epson salt soaks.  He says it is better than it was but it still painful to walk and it is still swollen.  Does feel better with rest.   Past Medical History:  Diagnosis Date  . Diabetes mellitus ([place])   . Hyperlipidemia     No past surgical history on file.  No family history on file.  Social History   Socioeconomic History  . Marital status: Married    Spouse name: Not on file  . Number of children: Not on file  . Years of education: Not on file  . Highest education level: Not on file  Occupational History  . Occupation: bus Education administrator: [employer]  . Financial resource strain: Not on file  . Food insecurity:    Worry: Not on file    Inability: Not on file  . Transportation needs:    Medical: Not on file    Non-medical: Not on file  Tobacco Use  . Smoking status: Never Smoker  . Smokeless tobacco: Never Used  Substance and Sexual Activity  . Alcohol use: Yes    Alcohol/week: 1.0 standard drinks    Types: 1 Standard drinks or equivalent per week  . Drug use: No  . Sexual activity: Not Currently  Lifestyle  . Physical activity:    Days per week: Not on file    Minutes per session: Not on file  . Stress: Not on file  Relationships  . Social connections:    Talks on phone: Not on file   Gets together: Not on file    Attends religious service: Not on file    Active member of club or organization: Not on file    Attends meetings of clubs or organizations: Not on file    Relationship status: Not on file  . Intimate partner violence:    Fear of current or ex partner: Not on file    Emotionally abused: Not on file    Physically abused: Not on file    Forced sexual activity: Not on file  Other Topics Concern  . Not on file  Social History Narrative  . Not on file    Outpatient Medications Prior to Visit  Medication Sig Dispense Refill  . aspirin EC 81 MG tablet Take 1 tablet (81 mg total) by mouth daily. 90 tablet 3  . blood glucose meter kit and supplies KIT Dispense based on patient and insurance preference. Use daily as directed. Please include lancets, test strips, control solution. 1 each 1  . metFORMIN (GLUCOPHAGE) 1000 MG tablet Take 1 tablet (1,000 mg total) by mouth daily with breakfast. 90 tablet 1  .  ONETOUCH DELICA LANCETS 48N MISC USE ONE LANCTES TO CHECK BLOOD SUGAR ONCE DAILY AS DIRECTED 100 each 1  . ranitidine (ZANTAC) 150 MG tablet Take 1 tablet (150 mg total) by mouth at bedtime. 90 tablet 1  . rosuvastatin (CRESTOR) 10 MG tablet Take 1 tablet (10 mg total) by mouth daily. 90 tablet 1  . diclofenac sodium (VOLTAREN) 1 % GEL Apply 2-4 g topically 4 (four) times daily. 100 g 3  . meloxicam (MOBIC) 7.5 MG tablet Take 1-2 tablets (7.5-15 mg total) by mouth daily. For one week, then as-need for pain after that 30 tablet 3   No facility-administered medications prior to visit.     No Known Allergies  ROS     Objective:    Physical Exam  Constitutional: He is oriented to person, place, and time. He appears well-developed and well-nourished.  HENT:  Head: Normocephalic and atraumatic.  Eyes: Conjunctivae and EOM are normal.  Cardiovascular: Normal rate.  Pulmonary/Chest: Effort normal.  Musculoskeletal:     Comments: This was swelling around the  left lateral malleolus.  Tender around that area as well.  Normal strength with flexion and dorsiflexion and inversion and eversion.  Increased laxity with anterior drawer test.  Nontender over the Achilles.  And nontender over the metatarsals.  Neurological: He is alert and oriented to person, place, and time.  Skin: Skin is dry. No pallor.  Psychiatric: He has a normal mood and affect. His behavior is normal.  Vitals reviewed.   BP (!) 147/97   Pulse 76   Ht 5' 7"  (1.702 m)   Wt 149 lb (67.6 kg)   SpO2 100%   BMI 23.34 kg/m  Wt Readings from Last 3 Encounters:  08/15/18 149 lb (67.6 kg)  04/18/18 140 lb (63.5 kg)  12/13/17 143 lb 14.4 oz (65.3 kg)    Health Maintenance Due  Topic Date Due  . FOOT EXAM  11/12/2017  . OPHTHALMOLOGY EXAM  03/06/2018    There are no preventive care reminders to display for this patient.   Lab Results  Component Value Date   TSH 1.397 05/16/2015   Lab Results  Component Value Date   WBC 7.9 08/16/2017   HGB 16.4 08/16/2017   HCT 48.3 08/16/2017   MCV 90.1 08/16/2017   PLT 333 08/16/2017   Lab Results  Component Value Date   NA 137 08/16/2017   K 4.6 08/16/2017   CO2 31 08/16/2017   GLUCOSE 135 (H) 08/16/2017   BUN 8 08/16/2017   CREATININE 0.89 08/16/2017   BILITOT 1.0 08/16/2017   ALKPHOS 74 11/12/2016   AST 24 08/16/2017   ALT 24 08/16/2017   PROT 7.6 08/16/2017   ALBUMIN 4.3 11/12/2016   CALCIUM 9.9 08/16/2017   Lab Results  Component Value Date   CHOL 121 08/16/2017   Lab Results  Component Value Date   HDL 44 08/16/2017   Lab Results  Component Value Date   LDLCALC 54 08/16/2017   Lab Results  Component Value Date   TRIG 152 (H) 08/16/2017   Lab Results  Component Value Date   CHOLHDL 2.8 08/16/2017   Lab Results  Component Value Date   HGBA1C 7.1 (A) 04/18/2018       Assessment & Plan:   Problem List Items Addressed This Visit      Cardiovascular and Mediastinum   Essential hypertension     Not well controlled.  Looking back over his blood pressures over the last several times he  is been here about half of them have been elevated and the other half have been borderline.  Recommend a low-dose ACE inhibitor for renal protection because of his diabetes and also for blood pressure control.      Relevant Medications   lisinopril (PRINIVIL,ZESTRIL) 5 MG tablet    Other Visit Diagnoses    Acute left ankle pain    -  Primary   Relevant Orders   DG Ankle Complete Left   Sprain of left ankle, unspecified ligament, initial encounter         Left lateral ankle pain/ankle sprain-reviewed x-ray films and I did not see any fracture will have the radiologist read them and call him if the result is different.  I reviewed the information with him today and recommended an ASO brace for support.  Encouraged him to wear it during the daytime for the next 2 weeks.  He does not need to wear it at night and he can take it off to shower.  This will provide some extra support.  Okay to return to work on Monday as long as he is not overusing that ankle.  2 weeks okay to gradually decrease use of the brace but if he is continuing to have the pain after 2 to 3 weeks then please let us know.  Also given a handout on exercises and stretches to do on his own.  I want him to start doing those in about a week.   Meds ordered this encounter  Medications  . lisinopril (PRINIVIL,ZESTRIL) 5 MG tablet    Sig: Take 1 tablet (5 mg total) by mouth daily.    Dispense:  30 tablet    Refill:  1     Beatrice Lecher, MD

## 2018-08-15 NOTE — Assessment & Plan Note (Signed)
Not well controlled.  Looking back over his blood pressures over the last several times he is been here about half of them have been elevated and the other half have been borderline.  Recommend a low-dose ACE inhibitor for renal protection because of his diabetes and also for blood pressure control.

## 2018-09-01 ENCOUNTER — Ambulatory Visit: Payer: BC Managed Care – PPO | Admitting: Osteopathic Medicine

## 2018-09-01 ENCOUNTER — Encounter: Payer: Self-pay | Admitting: Osteopathic Medicine

## 2018-09-01 VITALS — BP 137/94 | HR 80 | Temp 97.9°F | Wt 149.9 lb

## 2018-09-01 DIAGNOSIS — Z125 Encounter for screening for malignant neoplasm of prostate: Secondary | ICD-10-CM

## 2018-09-01 DIAGNOSIS — E119 Type 2 diabetes mellitus without complications: Secondary | ICD-10-CM | POA: Diagnosis not present

## 2018-09-01 DIAGNOSIS — Z13220 Encounter for screening for lipoid disorders: Secondary | ICD-10-CM

## 2018-09-01 LAB — POCT GLYCOSYLATED HEMOGLOBIN (HGB A1C): Hemoglobin A1C: 6.9 % — AB (ref 4.0–5.6)

## 2018-09-01 MED ORDER — LISINOPRIL 5 MG PO TABS: 5.0000 mg | ORAL_TABLET | Freq: Every day | ORAL | 1 refills | Status: DC

## 2018-09-01 NOTE — Patient Instructions (Signed)
If ankle is not better or if it gets worse, I would recommend follow-up with one of our sports medicine specialists (Dr Georgina Snell or Dr. Patton Salles Dr. Darene Lamer) for further evaluation in 2-4 weeks. Just call our office and ask for an appointment for sports medicine!

## 2018-09-01 NOTE — Progress Notes (Signed)
HPI: Nicholas Salazar is a 58 y.o. male who  has a past medical history of Diabetes mellitus ([place]) and Hyperlipidemia.  he presents to [facility] today, 09/01/18,  for chief complaint of:  DM2 follow-up  DIABETES SCREENING/PREVENTIVE CARE: A1C past 3-6 mos: Yes  controlled? Yes   Today 09/01/18: 6.9% BP goal <130/80: No -hasn't started ACE BP Readings from Last 3 Encounters:  09/01/18 (!) 137/94  08/15/18 (!) 147/97  04/18/18 131/87  LDL goal <70: Yes  and due to recheck  Eye exam annually: none on file, importance discussed with patient Foot exam: No  Microalbuminuria:should be on ACE, will discuss Metformin: Yes  ACE/ARB: should be on this, will disucc Antiplatelet if ASCVD Risk >10%: No  Statin: Yes  Pneumovax: Yes   Immunization History  Administered Date(s) Administered  . Influenza-Unspecified 06/13/2014  . Pneumococcal Polysaccharide-23 11/12/2016    Ankle recheck . 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 Location: L ankle . Quality: sore, swelling . Duration: about a month at this point . Modifying factors: better with rest, Dr Jerilynn Mages had advised ASO brace and rest/time off work as needed, given home exercises to try      At today's visit... Past medical history, surgical history, and family history reviewed and updated as needed.  Current medication list and allergy/intolerance information reviewed and updated as needed. (See remainder of HPI, ROS, Phys Exam below)          ASSESSMENT/PLAN: The primary encounter diagnosis was Diabetes mellitus without complication ([place]). Diagnoses of Prostate cancer screening and Lipid screening were also pertinent to this visit.   Advised start the Lisinopril for BP control and renal protection. Labs today to monitor.   Orders Placed This Encounter   Procedures  . CBC  . COMPLETE METABOLIC PANEL WITH GFR  . Lipid panel  . PSA, Total with Reflex to PSA, Free  . POCT HgB A1C        Patient Instructions  If ankle is not better or if it gets worse, I would recommend follow-up with one of our sports medicine specialists (Dr Georgina Snell or Dr. Patton Salles Dr. Darene Lamer) for further evaluation in 2-4 weeks. Just call our office and ask for an appointment for sports medicine!           Follow-up plan: Return in about 3 months (around 12/01/2018) for A1C / DIABETES MONITORING or sooner if needed .                             ############################################ ############################################ ############################################ ############################################    Current Meds  Medication Sig  . aspirin EC 81 MG tablet Take 1 tablet (81 mg total) by mouth daily.  . blood glucose meter kit and supplies KIT Dispense based on patient and insurance preference. Use daily as directed. Please include lancets, test strips, control solution.  . metFORMIN (GLUCOPHAGE) 1000 MG tablet Take 1 tablet (1,000 mg total) by mouth daily with breakfast.  . ONETOUCH DELICA LANCETS 40X MISC USE ONE LANCTES TO CHECK BLOOD SUGAR ONCE DAILY AS DIRECTED  . ranitidine (ZANTAC) 150 MG tablet Take 1 tablet (150 mg total) by mouth at bedtime.  . rosuvastatin (CRESTOR) 10 MG tablet Take 1 tablet (10 mg total) by mouth daily.    No Known Allergies  Review of Systems:  Constitutional: No recent illness  HEENT: No  headache, no vision change  Cardiac: No  chest pain, No  pressure, No palpitations  Respiratory:  No  shortness of breath. No  Cough  Gastrointestinal: No  abdominal pain  Musculoskeletal: No new myalgia/arthralgia, ankle is slowly improving   Skin: No  Rash  Neurologic: No  weakness, No  Dizziness   Exam:  BP (!) 137/94 (BP Location: Left Arm, Patient Position:  Sitting, Cuff Size: Normal)   Pulse 80   Temp 97.9 F (36.6 C) (Oral)   Wt 149 lb 14.4 oz (68 kg)   BMI 23.48 kg/m   Constitutional: VS see above. General Appearance: alert, well-developed, well-nourished, NAD  Eyes: Normal lids and conjunctive, non-icteric sclera  Ears, Nose, Mouth, Throat: MMM, Normal external inspection ears/nares/mouth/lips/gums.  Neck: No masses, trachea midline.   Respiratory: Normal respiratory effort. no wheeze, no rhonchi, no rales  Cardiovascular: S1/S2 normal, no murmur, no rub/gallop auscultated. RRR.   Musculoskeletal: Gait normal. Symmetric and independent movement of all extremities  Neurological: Normal balance/coordination. No tremor.  Skin: warm, dry, intact.   Psychiatric: Normal judgment/insight. Normal mood and affect. Oriented x3.       Visit summary with medication list and pertinent instructions was printed for patient to review, patient was advised to alert [place] if any updates are needed. All questions at time of visit were answered - patient instructed to contact office with any additional concerns. ER/RTC precautions were reviewed with the patient and understanding verbalized.     Please note: voice recognition software was used to produce this document, and typos may escape review. Please contact Dr. Sheppard Coil for any needed clarifications.    Follow up plan: Return in about 3 months (around 12/01/2018) for A1C / DIABETES MONITORING or sooner if needed .

## 2018-09-02 LAB — CBC
HCT: 45.8 % (ref 38.5–50.0)
Hemoglobin: 15.7 g/dL (ref 13.2–17.1)
MCH: 31 pg (ref 27.0–33.0)
MCHC: 34.3 g/dL (ref 32.0–36.0)
MCV: 90.5 fL (ref 80.0–100.0)
MPV: 11.1 fL (ref 7.5–12.5)
Platelets: 288 10*3/uL (ref 140–400)
RBC: 5.06 10*6/uL (ref 4.20–5.80)
RDW: 12.8 % (ref 11.0–15.0)
WBC: 8 10*3/uL (ref 3.8–10.8)

## 2018-09-02 LAB — LIPID PANEL
Cholesterol: 122 mg/dL (ref <200)
HDL: 47 mg/dL (ref >40)
LDL Cholesterol (Calc): 56 mg/dL (calc)
Non-HDL Cholesterol (Calc): 75 mg/dL (calc) (ref <130)
Total CHOL/HDL Ratio: 2.6 (calc) (ref <5.0)
Triglycerides: 108 mg/dL (ref <150)

## 2018-09-02 LAB — COMPLETE METABOLIC PANEL WITH GFR
AG Ratio: 1.7 (calc) (ref 1.0–2.5)
ALKALINE PHOSPHATASE (APISO): 78 U/L (ref 40–115)
ALT: 26 U/L (ref 9–46)
AST: 19 U/L (ref 10–35)
Albumin: 4.3 g/dL (ref 3.6–5.1)
BUN: 12 mg/dL (ref 7–25)
CALCIUM: 9.3 mg/dL (ref 8.6–10.3)
CO2: 30 mmol/L (ref 20–32)
Chloride: 103 mmol/L (ref 98–110)
Creat: 0.9 mg/dL (ref 0.70–1.33)
GFR, Est African American: 109 mL/min/{1.73_m2} (ref >=60)
GFR, Est Non African American: 94 mL/min/{1.73_m2} (ref >=60)
Globulin: 2.6 g/dL (calc) (ref 1.9–3.7)
Glucose, Bld: 139 mg/dL — ABNORMAL HIGH (ref 65–99)
Potassium: 4.5 mmol/L (ref 3.5–5.3)
Sodium: 139 mmol/L (ref 135–146)
Total Bilirubin: 0.6 mg/dL (ref 0.2–1.2)
Total Protein: 6.9 g/dL (ref 6.1–8.1)

## 2018-09-02 LAB — PSA, TOTAL WITH REFLEX TO PSA, FREE: PSA, Total: 0.9 ng/mL (ref <=4.0)

## 2018-12-01 ENCOUNTER — Ambulatory Visit: Payer: BC Managed Care – PPO | Admitting: Osteopathic Medicine

## 2018-12-04 ENCOUNTER — Other Ambulatory Visit: Payer: Self-pay

## 2018-12-04 ENCOUNTER — Ambulatory Visit: Payer: BC Managed Care – PPO | Admitting: Osteopathic Medicine

## 2018-12-04 ENCOUNTER — Encounter: Payer: Self-pay | Admitting: Osteopathic Medicine

## 2018-12-04 VITALS — BP 134/79 | HR 79 | Temp 98.2°F | Wt 146.5 lb

## 2018-12-04 DIAGNOSIS — I10 Essential (primary) hypertension: Secondary | ICD-10-CM | POA: Diagnosis not present

## 2018-12-04 DIAGNOSIS — E119 Type 2 diabetes mellitus without complications: Secondary | ICD-10-CM

## 2018-12-04 LAB — POCT GLYCOSYLATED HEMOGLOBIN (HGB A1C): Hemoglobin A1C: 7.3 % — AB (ref 4.0–5.6)

## 2018-12-04 MED ORDER — LISINOPRIL 5 MG PO TABS: 5.0000 mg | ORAL_TABLET | Freq: Every day | ORAL | 1 refills | Status: DC

## 2018-12-04 MED ORDER — DAPAGLIFLOZIN PRO-METFORMIN ER 5-1000 MG PO TB24: 1.0000 | ORAL_TABLET | Freq: Every day | ORAL | 3 refills | Status: DC

## 2018-12-04 NOTE — Progress Notes (Signed)
HPI: Nicholas Salazar is a 58 y.o. male who  has a past medical history of Diabetes mellitus ([place]) and Hyperlipidemia.  he presents to [facility] today, 12/04/18,  for chief complaint of:  DM2 f/u  DIABETES SCREENING/PREVENTIVE CARE: updated 12/04/18  A1C past 3-6 mos: Yes  controlled? Yes   09/01/18: 6.9%  Today 12/04/18: 7.3% Current meds:    Metformin 1000 mg daily  BP goal <130/80: No -hasn't started ACE  BP Readings from Last 3 Encounters:  12/04/18 134/79  09/01/18 (!) 137/94  08/15/18 (!) 147/97  LDL goal <70: Yes 08/2018 Eye exam annually: none on file, importance discussed with patient Foot exam: No  Microalbuminuria: should be on ACE, will discuss, he still hasn't started this medicine due to concerns about a friend being on this and their sodium was affected.  Metformin: Yes  ACE/ARB: should be on this, see above Antiplatelet if ASCVD Risk >10%: No  Statin: Yes  Pneumovax: Yes       At today's visit 12/04/18 ... PMH, PSH, FH reviewed and updated as needed.  Current medication list and allergy/intolerance hx reviewed and updated as needed. (See remainder of HPI, ROS, Phys Exam below)   No results found.  Results for orders placed or performed in visit on 12/04/18 (from the past 72 hour(s))  POCT HgB A1C     Status: Abnormal   Collection Time: 12/04/18 11:05 AM  Result Value Ref Range   Hemoglobin A1C 7.3 (A) 4.0 - 5.6 %   HbA1c POC (<> result, manual entry)     HbA1c, POC (prediabetic range)     HbA1c, POC (controlled diabetic range)            ASSESSMENT/PLAN: The primary encounter diagnosis was Diabetes mellitus without complication ([place]). A diagnosis of Essential hypertension was also pertinent to this visit.   Orders Placed This Encounter  Procedures  . COMPLETE METABOLIC PANEL WITH GFR  . POCT HgB A1C     Meds ordered this encounter  Medications  . Dapagliflozin-metFORMIN HCl ER (XIGDUO XR) 12-998 MG  TB24    Sig: Take 1 tablet by mouth daily.    Dispense:  90 tablet    Refill:  3    Cancel metformin  . lisinopril (ZESTRIL) 5 MG tablet    Sig: Take 1 tablet (5 mg total) by mouth daily.    Dispense:  90 tablet    Refill:  1    Cancel previous 30 days thanks    Patient Instructions  Plan:  Stop metformin and start Xigduo for sugars, this is a once daily medicine  Start the lisinopril for blood pressure and kidney protection  Will plan to get blood work in one month to make sure your body is doing ok on the new medicines.   Will see you here in the office in 3 months to recheck A1C. See me sooner if needed!          Follow-up plan: Return in about 4 weeks (around 01/01/2019) for LAB VISIT ONLY. Appt in 3 months w/ Dr Sheppard Coil for follow-up diabetes and blood pressure .                                                 ################################################# ################################################# ################################################# #################################################    Current Meds  Medication Sig  .  blood glucose meter kit and supplies KIT Dispense based on patient and insurance preference. Use daily as directed. Please include lancets, test strips, control solution.  Glory Rosebush DELICA LANCETS 66K MISC USE ONE LANCTES TO CHECK BLOOD SUGAR ONCE DAILY AS DIRECTED  . rosuvastatin (CRESTOR) 10 MG tablet Take 1 tablet (10 mg total) by mouth daily.  . [DISCONTINUED] metFORMIN (GLUCOPHAGE) 1000 MG tablet Take 1 tablet (1,000 mg total) by mouth daily with breakfast.    No Known Allergies     Review of Systems:  Constitutional: No recent illness  HEENT: No  headache, no vision change  Cardiac: No  chest pain, No  pressure, No palpitations  Respiratory:  No  shortness of breath. No  Cough  Neurologic: No  weakness, No  Dizziness  Psychiatric: No  concerns with  depression, No  concerns with anxiety  Exam:  BP 134/79 (BP Location: Left Arm, Patient Position: Sitting, Cuff Size: Normal)   Pulse 79   Temp 98.2 F (36.8 C) (Oral)   Wt 146 lb 8 oz (66.5 kg)   BMI 22.95 kg/m   Constitutional: VS see above. General Appearance: alert, well-developed, well-nourished, NAD  Eyes: Normal lids and conjunctive, non-icteric sclera  Ears, Nose, Mouth, Throat: MMM, Normal external inspection ears/nares/mouth/lips/gums.  Neck: No masses, trachea midline.   Respiratory: Normal respiratory effort.   Musculoskeletal: Gait normal. Symmetric and independent movement of all extremities  Neurological: Normal balance/coordination. No tremor.  Skin: warm, dry, intact.   Psychiatric: Normal judgment/insight. Normal mood and affect. Oriented x3.       Visit summary with medication list and pertinent instructions was printed for patient to review, patient was advised to alert [place] if any updates are needed. All questions at time of visit were answered - patient instructed to contact office with any additional concerns. ER/RTC precautions were reviewed with the patient and understanding verbalized.     Please note: voice recognition software was used to produce this document, and typos may escape review. Please contact Dr. Sheppard Coil for any needed clarifications.    Follow up plan: Return in about 4 weeks (around 01/01/2019) for LAB VISIT ONLY. Appt in 3 months w/ Dr Sheppard Coil for follow-up diabetes and blood pressure .

## 2018-12-04 NOTE — Patient Instructions (Addendum)
Plan:  Stop metformin and start Xigduo for sugars, this is a once daily medicine  Start the lisinopril for blood pressure and kidney protection  Will plan to get blood work in one month to make sure your body is doing ok on the new medicines.   Will see you here in the office in 3 months to recheck A1C. See me sooner if needed!

## 2019-03-03 ENCOUNTER — Telehealth: Payer: Self-pay | Admitting: Osteopathic Medicine

## 2019-03-03 DIAGNOSIS — I10 Essential (primary) hypertension: Secondary | ICD-10-CM

## 2019-03-03 DIAGNOSIS — E119 Type 2 diabetes mellitus without complications: Secondary | ICD-10-CM

## 2019-03-03 NOTE — Telephone Encounter (Addendum)
Spoke to pt.  Virtual appointment is scheduled for 7/28. He wants lab orders sent down for TOMORROW, July 22, he's going to the lab in the morning. He needs refill on his Cholesterol meds.  Thanks

## 2019-03-04 NOTE — Telephone Encounter (Signed)
Left pt msg advising labs ready

## 2019-03-04 NOTE — Telephone Encounter (Signed)
Thanks

## 2019-03-04 NOTE — Telephone Encounter (Signed)
Labs ordered.

## 2019-03-05 ENCOUNTER — Ambulatory Visit: Payer: BC Managed Care – PPO | Admitting: Osteopathic Medicine

## 2019-03-05 LAB — COMPLETE METABOLIC PANEL WITH GFR
AG Ratio: 1.7 (calc) (ref 1.0–2.5)
ALT: 14 U/L (ref 9–46)
AST: 15 U/L (ref 10–35)
Albumin: 4.3 g/dL (ref 3.6–5.1)
Alkaline phosphatase (APISO): 74 U/L (ref 35–144)
BUN: 12 mg/dL (ref 7–25)
CO2: 28 mmol/L (ref 20–32)
Calcium: 9.2 mg/dL (ref 8.6–10.3)
Chloride: 103 mmol/L (ref 98–110)
Creat: 0.88 mg/dL (ref 0.70–1.33)
GFR, Est African American: 110 mL/min/{1.73_m2} (ref >=60)
GFR, Est Non African American: 95 mL/min/{1.73_m2} (ref >=60)
Globulin: 2.6 g/dL (calc) (ref 1.9–3.7)
Glucose, Bld: 114 mg/dL — ABNORMAL HIGH (ref 65–99)
Potassium: 4.5 mmol/L (ref 3.5–5.3)
Sodium: 135 mmol/L (ref 135–146)
Total Bilirubin: 0.8 mg/dL (ref 0.2–1.2)
Total Protein: 6.9 g/dL (ref 6.1–8.1)

## 2019-03-09 LAB — HM DIABETES EYE EXAM

## 2019-03-10 ENCOUNTER — Ambulatory Visit (INDEPENDENT_AMBULATORY_CARE_PROVIDER_SITE_OTHER): Payer: BC Managed Care – PPO | Admitting: Osteopathic Medicine

## 2019-03-10 ENCOUNTER — Encounter: Payer: Self-pay | Admitting: Osteopathic Medicine

## 2019-03-10 DIAGNOSIS — E119 Type 2 diabetes mellitus without complications: Secondary | ICD-10-CM | POA: Diagnosis not present

## 2019-03-10 DIAGNOSIS — I10 Essential (primary) hypertension: Secondary | ICD-10-CM | POA: Diagnosis not present

## 2019-03-10 DIAGNOSIS — Z1211 Encounter for screening for malignant neoplasm of colon: Secondary | ICD-10-CM

## 2019-03-10 MED ORDER — LOSARTAN POTASSIUM 25 MG PO TABS: 25.0000 mg | ORAL_TABLET | Freq: Every day | ORAL | 1 refills | Status: DC

## 2019-03-10 NOTE — Progress Notes (Signed)
Virtual Visit via Video (App used: Doximity) Note  I connected with      Nicholas Salazar on 03/10/19 at 1:00 by a telemedicine application and verified that I am speaking with the correct person using two identifiers.  Patient is at home I am in office   I discussed the limitations of evaluation and management by telemedicine and the availability of in person appointments. The patient expressed understanding and agreed to proceed.  History of Present Illness: Nicholas Salazar is a 58 y.o. male who would like to discuss DM2, HTN f/u   DM2:  Last A1C 11/2018: 7.3% Today, still pending, no lab results   HTN:  Hasn't been able to check BP   Cough: Mild dry cough for 3 months  No SOB, no fever  Weight: Reports weight loss, concerned      Observations/Objective: There were no vitals taken for this visit. BP Readings from Last 3 Encounters:  03/11/19 111/79  12/04/18 134/79  09/01/18 (!) 137/94   Exam: Normal Speech.      Lab and Radiology Results No results found for this or any previous visit (from the past 72 hour(s)). No results found.     Assessment and Plan: 58 y.o. male with The primary encounter diagnosis was Essential hypertension. Diagnoses of Diabetes mellitus without complication ([place]) and Colon cancer screening were also pertinent to this visit.   PDMP not reviewed this encounter. Orders Placed This Encounter  Procedures  . Ambulatory referral to Gastroenterology    Referral Priority:   Routine    Referral Type:   Consultation    Referral Reason:   Specialty Services Required    Number of Visits Requested:   1   Stop ACE (?cough?) Start ARB Meds ordered this encounter  Medications  . losartan (COZAAR) 25 MG tablet    Sig: Take 1 tablet (25 mg total) by mouth daily.    Dispense:  90 tablet    Refill:  1      Follow Up Instructions: Return in about 3 months (around 06/10/2019) for recheck A1C .    I discussed the assessment and treatment  plan with the patient. The patient was provided an opportunity to ask questions and all were answered. The patient agreed with the plan and demonstrated an understanding of the instructions.   The patient was advised to call back or seek an in-person evaluation if any new concerns, if symptoms worsen or if the condition fails to improve as anticipated.  25 minutes of non-face-to-face time was provided during this encounter.                      Historical information moved to improve visibility of documentation.  Past Medical History:  Diagnosis Date  . Diabetes mellitus ([place])   . Hyperlipidemia    No past surgical history on file. Social History   Tobacco Use  . Smoking status: Never Smoker  . Smokeless tobacco: Never Used  Substance Use Topics  . Alcohol use: Yes    Alcohol/week: 1.0 standard drinks    Types: 1 Standard drinks or equivalent per week   family history is not on file.  Medications: Current Outpatient Medications  Medication Sig Dispense Refill  . blood glucose meter kit and supplies KIT Dispense based on patient and insurance preference. Use daily as directed. Please include lancets, test strips, control solution. 1 each 1  . Dapagliflozin-metFORMIN HCl ER (XIGDUO XR) 12-998 MG TB24 Take 1 tablet by  mouth daily. 90 tablet 3  . losartan (COZAAR) 25 MG tablet Take 1 tablet (25 mg total) by mouth daily. 90 tablet 1  . ONETOUCH DELICA LANCETS 01U MISC USE ONE LANCTES TO CHECK BLOOD SUGAR ONCE DAILY AS DIRECTED 100 each 1  . rosuvastatin (CRESTOR) 10 MG tablet Take 1 tablet (10 mg total) by mouth daily. 90 tablet 1   No current facility-administered medications for this visit.    Allergies  Allergen Reactions  . Lisinopril Cough    PDMP not reviewed this encounter. Orders Placed This Encounter  Procedures  . Ambulatory referral to Gastroenterology    Referral Priority:   Routine    Referral Type:   Consultation    Referral Reason:    Specialty Services Required    Number of Visits Requested:   1   Meds ordered this encounter  Medications  . losartan (COZAAR) 25 MG tablet    Sig: Take 1 tablet (25 mg total) by mouth daily.    Dispense:  90 tablet    Refill:  1

## 2019-03-11 ENCOUNTER — Ambulatory Visit (INDEPENDENT_AMBULATORY_CARE_PROVIDER_SITE_OTHER): Payer: BC Managed Care – PPO | Admitting: Osteopathic Medicine

## 2019-03-11 ENCOUNTER — Other Ambulatory Visit: Payer: Self-pay

## 2019-03-11 VITALS — BP 111/79 | HR 79 | Ht 67.0 in | Wt 136.0 lb

## 2019-03-11 DIAGNOSIS — I10 Essential (primary) hypertension: Secondary | ICD-10-CM

## 2019-03-11 DIAGNOSIS — E119 Type 2 diabetes mellitus without complications: Secondary | ICD-10-CM

## 2019-03-11 NOTE — Progress Notes (Signed)
Patient presents to office today for blood pressure check, weight check, and to have blood drawn.   Pt's blood pressure and weight were both within normal limits. Confirmed with patient that he has had recent eye exam, I have faxed [facility] on [address] for a copy of this. Diabetic foot exam preformed and no issues nor complains. Did note a scratch on top of right foot, patient reports he dropped something on it about a week ago. Advised patient to keep an eye on that spot and to make sure to keep it clean, instructed him to call us if not healing or redness/infection develops.

## 2019-03-12 ENCOUNTER — Other Ambulatory Visit: Payer: Self-pay | Admitting: Osteopathic Medicine

## 2019-03-12 LAB — COMPLETE METABOLIC PANEL WITH GFR
AG Ratio: 1.6 (calc) (ref 1.0–2.5)
ALT: 18 U/L (ref 9–46)
AST: 18 U/L (ref 10–35)
Albumin: 4.6 g/dL (ref 3.6–5.1)
Alkaline phosphatase (APISO): 83 U/L (ref 35–144)
BUN: 12 mg/dL (ref 7–25)
CO2: 27 mmol/L (ref 20–32)
Calcium: 10 mg/dL (ref 8.6–10.3)
Chloride: 100 mmol/L (ref 98–110)
Creat: 0.97 mg/dL (ref 0.70–1.33)
GFR, Est African American: 99 mL/min/{1.73_m2} (ref >=60)
GFR, Est Non African American: 86 mL/min/{1.73_m2} (ref >=60)
Globulin: 2.9 g/dL (calc) (ref 1.9–3.7)
Glucose, Bld: 115 mg/dL — ABNORMAL HIGH (ref 65–99)
Potassium: 5.1 mmol/L (ref 3.5–5.3)
Sodium: 133 mmol/L — ABNORMAL LOW (ref 135–146)
Total Bilirubin: 0.7 mg/dL (ref 0.2–1.2)
Total Protein: 7.5 g/dL (ref 6.1–8.1)

## 2019-03-12 LAB — HEMOGLOBIN A1C
Hgb A1c MFr Bld: 6.4 % of total Hgb — ABNORMAL HIGH (ref <5.7)
Mean Plasma Glucose: 137 (calc)
eAG (mmol/L): 7.6 (calc)

## 2019-03-12 LAB — LIPID PANEL
Cholesterol: 137 mg/dL (ref <200)
HDL: 42 mg/dL (ref >=40)
LDL Cholesterol (Calc): 70 mg/dL (calc)
Non-HDL Cholesterol (Calc): 95 mg/dL (calc) (ref <130)
Total CHOL/HDL Ratio: 3.3 (calc) (ref <5.0)
Triglycerides: 172 mg/dL — ABNORMAL HIGH (ref <150)

## 2019-05-07 ENCOUNTER — Encounter: Payer: Self-pay | Admitting: Internal Medicine

## 2019-09-04 ENCOUNTER — Other Ambulatory Visit: Payer: Self-pay | Admitting: Osteopathic Medicine

## 2019-09-04 NOTE — Telephone Encounter (Signed)
Must make appointment before any further refills given 

## 2019-10-06 ENCOUNTER — Telehealth (INDEPENDENT_AMBULATORY_CARE_PROVIDER_SITE_OTHER): Payer: BC Managed Care – PPO | Admitting: Medical-Surgical

## 2019-10-06 ENCOUNTER — Encounter: Payer: Self-pay | Admitting: Medical-Surgical

## 2019-10-06 DIAGNOSIS — I10 Essential (primary) hypertension: Secondary | ICD-10-CM | POA: Diagnosis not present

## 2019-10-06 DIAGNOSIS — E119 Type 2 diabetes mellitus without complications: Secondary | ICD-10-CM | POA: Diagnosis not present

## 2019-10-06 MED ORDER — XIGDUO XR 5-1000 MG PO TB24: 1.0000 | ORAL_TABLET | Freq: Every day | ORAL | 3 refills | Status: DC

## 2019-10-06 MED ORDER — LOSARTAN POTASSIUM 25 MG PO TABS: 25.0000 mg | ORAL_TABLET | Freq: Every day | ORAL | 3 refills | Status: DC

## 2019-10-06 NOTE — Assessment & Plan Note (Addendum)
Continue Xigduo daily.  Continue to avoid concentrated sweets and simple carbohydrates.  Checking hemoglobin A1c.

## 2019-10-06 NOTE — Progress Notes (Signed)
Virtual Visit via Video Note  I connected with Nicholas Salazar on 10/06/19 at 10:10 AM EST by a video enabled telemedicine application and verified that I am speaking with the correct person using two identifiers.   I discussed the limitations of evaluation and management by telemedicine and the availability of in person appointments. The patient expressed understanding and agreed to proceed.  Subjective:    CC: HTN, Diabetes follow up  HPI: 59 year old gentleman presenting via Doximity for hypertension and diabetes follow-up.  HTN- taking losartan 25 mg daily, no missed doses.  Checking blood pressures 1-2 times per week, last reading 114/79.  Denies chest pain, shortness of breath, palpitations, leg swelling, headaches, dizziness.  Following low-sodium diet.  DM- taking Xigduo daily.  Checking blood sugars at home, last reading 123 fasting.  No vision changes, numbness/tingling of extremities.  Avoiding concentrated sweets.   Past medical history, Surgical history, Family history not pertinant except as noted below, Social history, Allergies, and medications have been entered into the medical record, reviewed, and corrections made.   Review of Systems: No fevers, chills, night sweats, weight loss, chest pain, or shortness of breath.   Objective:    General: Speaking clearly in complete sentences without any shortness of breath.  Alert and oriented x3.  Normal judgment. No apparent acute distress.    Impression and Recommendations:    Essential hypertension Continue losartan 25 mg daily, refills provided.  Checking CBC, CMP, lipids.  Diabetes mellitus without complication ([place]) Continue Xigduo daily.  Continue to avoid concentrated sweets and simple carbohydrates.  Checking hemoglobin A1c.  Lab orders entered today.  Patient advised to go to the lab at her earliest convenience to have blood drawn.  We will plan follow-up depending on lab results.    Follow-up: If labs look good and  hemoglobin A1c at goal, will follow up in 6 months otherwise plan for follow-up in 3 months.  I discussed the assessment and treatment plan with the patient. The patient was provided an opportunity to ask questions and all were answered. The patient agreed with the plan and demonstrated an understanding of the instructions.   The patient was advised to call back or seek an in-person evaluation if the symptoms worsen or if the condition fails to improve as anticipated.  35 minutes of non-face-to-face time was provided during this encounter.  Clearnce Sorrel, DNP, APRN, FNP-BC [facility]

## 2019-10-06 NOTE — Assessment & Plan Note (Signed)
Continue losartan 25 mg daily, refills provided.  Checking CBC, CMP, lipids.

## 2019-10-07 ENCOUNTER — Telehealth: Payer: Self-pay | Admitting: Osteopathic Medicine

## 2019-10-07 ENCOUNTER — Other Ambulatory Visit: Payer: Self-pay | Admitting: Osteopathic Medicine

## 2019-10-07 DIAGNOSIS — I10 Essential (primary) hypertension: Secondary | ICD-10-CM

## 2019-10-08 NOTE — Telephone Encounter (Signed)
Error message received that transmission to pharmacy failed.

## 2019-10-10 LAB — CBC
HCT: 52 % — ABNORMAL HIGH (ref 38.5–50.0)
Hemoglobin: 17.4 g/dL — ABNORMAL HIGH (ref 13.2–17.1)
MCH: 31 pg (ref 27.0–33.0)
MCHC: 33.5 g/dL (ref 32.0–36.0)
MCV: 92.5 fL (ref 80.0–100.0)
MPV: 10.5 fL (ref 7.5–12.5)
Platelets: 354 10*3/uL (ref 140–400)
RBC: 5.62 10*6/uL (ref 4.20–5.80)
RDW: 13.8 % (ref 11.0–15.0)
WBC: 6.6 10*3/uL (ref 3.8–10.8)

## 2019-10-10 LAB — COMPLETE METABOLIC PANEL WITH GFR
AG Ratio: 1.6 (calc) (ref 1.0–2.5)
ALT: 14 U/L (ref 9–46)
AST: 15 U/L (ref 10–35)
Albumin: 4.4 g/dL (ref 3.6–5.1)
Alkaline phosphatase (APISO): 88 U/L (ref 35–144)
BUN: 16 mg/dL (ref 7–25)
CO2: 27 mmol/L (ref 20–32)
Calcium: 9.6 mg/dL (ref 8.6–10.3)
Chloride: 99 mmol/L (ref 98–110)
Creat: 1.04 mg/dL (ref 0.70–1.33)
GFR, Est African American: 91 mL/min/{1.73_m2} (ref >=60)
GFR, Est Non African American: 78 mL/min/{1.73_m2} (ref >=60)
Globulin: 2.8 g/dL (calc) (ref 1.9–3.7)
Glucose, Bld: 119 mg/dL — ABNORMAL HIGH (ref 65–99)
Potassium: 4.8 mmol/L (ref 3.5–5.3)
Sodium: 135 mmol/L (ref 135–146)
Total Bilirubin: 0.6 mg/dL (ref 0.2–1.2)
Total Protein: 7.2 g/dL (ref 6.1–8.1)

## 2019-10-10 LAB — LIPID PANEL
Cholesterol: 192 mg/dL (ref <200)
HDL: 44 mg/dL (ref >=40)
LDL Cholesterol (Calc): 116 mg/dL (calc) — ABNORMAL HIGH
Non-HDL Cholesterol (Calc): 148 mg/dL (calc) — ABNORMAL HIGH (ref <130)
Total CHOL/HDL Ratio: 4.4 (calc) (ref <5.0)
Triglycerides: 204 mg/dL — ABNORMAL HIGH (ref <150)

## 2019-10-10 LAB — HEMOGLOBIN A1C
Hgb A1c MFr Bld: 6.4 % of total Hgb — ABNORMAL HIGH (ref <5.7)
Mean Plasma Glucose: 137 (calc)
eAG (mmol/L): 7.6 (calc)

## 2020-06-01 ENCOUNTER — Encounter: Payer: Self-pay | Admitting: Osteopathic Medicine

## 2020-06-01 ENCOUNTER — Ambulatory Visit: Payer: BC Managed Care – PPO | Admitting: Osteopathic Medicine

## 2020-06-01 VITALS — BP 135/92 | HR 78 | Temp 97.9°F | Wt 142.1 lb

## 2020-06-01 DIAGNOSIS — Z125 Encounter for screening for malignant neoplasm of prostate: Secondary | ICD-10-CM

## 2020-06-01 DIAGNOSIS — E119 Type 2 diabetes mellitus without complications: Secondary | ICD-10-CM

## 2020-06-01 DIAGNOSIS — E559 Vitamin D deficiency, unspecified: Secondary | ICD-10-CM

## 2020-06-01 DIAGNOSIS — D751 Secondary polycythemia: Secondary | ICD-10-CM

## 2020-06-01 DIAGNOSIS — Z0000 Encounter for general adult medical examination without abnormal findings: Secondary | ICD-10-CM

## 2020-06-01 DIAGNOSIS — E782 Mixed hyperlipidemia: Secondary | ICD-10-CM

## 2020-06-01 DIAGNOSIS — I10 Essential (primary) hypertension: Secondary | ICD-10-CM | POA: Diagnosis not present

## 2020-06-01 NOTE — Progress Notes (Signed)
Nicholas Salazar is a 59 y.o. male who presents to  [place] at [facility]  today, 06/01/20, seeking care for the following:  . Follow up chronic conditions: see below      ASSESSMENT & PLAN with other pertinent findings:  The primary encounter diagnosis was Annual physical exam. Diagnoses of Diabetes mellitus without complication ([place]), Essential hypertension, Mixed hyperlipidemia, Vitamin D deficiency, Polycythemia, and Prostate cancer screening were also pertinent to this visit.   No results found for this or any previous visit (from the past 24 hour(s)).  DM2 decent control  LDL above goal BP above goal  Pt declines Rx changes today for antihypertensives/statins   Patient Instructions  General Preventive Care  Most recent routine screening labs: ordered today.   Blood pressure goal 130/80 or less.   Tobacco: don't! Alcohol: responsible moderation is ok for most adults - if you have concerns about your alcohol intake, please talk to me!   Exercise: as tolerated to reduce risk of cardiovascular disease and diabetes. Strength training will also prevent osteoporosis.   Mental health: if need for mental health care (medicines, counseling, other), or concerns about moods, please let me know!   Sexual / Reproductive health: if need for STD testing, or if concerns with libido/pain problems, please let me know!   Advanced Directive: Living Will and/or Healthcare Power of Attorney recommended for all adults, regardless of age or health.  Vaccines  Flu vaccine: for almost everyone, every fall.   Shingles vaccine: after age 28.   Pneumonia vaccines: after age 75  Tetanus booster: every 10 years   COVID vaccine: STRONGLY RECOMMENDED  Cancer screenings   Colon cancer screening: for everyone age 52-75. Colonoscopy available for all, many people also qualify for the Cologuard stool test   Prostate cancer screening: PSA blood test age  54-71 Infection screenings  . HIV: recommended screening at least once age 38-65 . Gonorrhea/Chlamydia: screening as needed . Hepatitis C: recommended once for everyone age 60-75 . TB: certain at-risk populations   Orders Placed This Encounter  Procedures  . CBC  . COMPLETE METABOLIC PANEL WITH GFR  . Lipid panel  . Hemoglobin A1c  . PSA, Total with Reflex to PSA, Free    No orders of the defined types were placed in this encounter.      Follow-up instructions: Return in about 6 months (around 11/30/2020) for IN-OFFICE VISIT MONITOR DIABETES - SEE [place] SOONER IF NEEDED .                                         BP (!) 135/92 (BP Location: Left Arm, Patient Position: Sitting, Cuff Size: Normal)   Pulse 78   Temp 97.9 F (36.6 C) (Oral)   Wt 142 lb 1.9 oz (64.5 kg)   BMI 22.26 kg/m   Constitutional:  . VSS, see nurse notes . General Appearance: alert, well-developed, well-nourished, NAD Eyes: Marland Kitchen Normal lids and conjunctive, non-icteric sclera Neck: . No masses, trachea midline . No thyroid enlargement/tenderness/mass appreciated Respiratory: . Normal respiratory effort . Breath sounds normal, no wheeze/rhonchi/rales Cardiovascular: . S1/S2 normal, no murmur/rub/gallop auscultated . No carotid bruit or JVD . Pedal pulse II/IV bilaterally DP and PT . No lower extremity edema Gastrointestinal: . Nontender, no masses . No hepatomegaly, no splenomegaly . No hernia appreciated Musculoskeletal:  . Gait normal . No clubbing/cyanosis of  digits Neurological: . No cranial nerve deficit on limited exam . Motor and sensation intact and symmetric Psychiatric: . Normal judgment/insight . Normal mood and affect   Current Meds  Medication Sig  . blood glucose meter kit and supplies KIT Dispense based on patient and insurance preference. Use daily as directed. Please include lancets, test strips, control solution.  .  Dapagliflozin-metFORMIN HCl ER (XIGDUO XR) 12-998 MG TB24 Take 1 tablet by mouth daily.  Marland Kitchen losartan (COZAAR) 25 MG tablet TAKE 1 TABLET BY MOUTH ONCE DAILY . APPOINTMENT REQUIRED FOR FUTURE REFILLS  . losartan (COZAAR) 25 MG tablet Take 1 tablet (25 mg total) by mouth daily. MUST MAKE APPOINTMENT  . ONETOUCH DELICA LANCETS 86P MISC USE ONE LANCTES TO CHECK BLOOD SUGAR ONCE DAILY AS DIRECTED    Results for orders placed or performed in visit on 06/01/20 (from the past 72 hour(s))  CBC     Status: Abnormal   Collection Time: 06/01/20 10:32 AM  Result Value Ref Range   WBC 8.2 3.8 - 10.8 Thousand/uL   RBC 5.56 4.20 - 5.80 Million/uL   Hemoglobin 17.3 (H) 13.2 - 17.1 g/dL   HCT 51.8 (H) 38 - 50 %   MCV 93.2 80.0 - 100.0 fL   MCH 31.1 27.0 - 33.0 pg   MCHC 33.4 32.0 - 36.0 g/dL   RDW 13.0 11.0 - 15.0 %   Platelets 264 140 - 400 Thousand/uL   MPV 13.9 (H) 7.5 - 12.5 fL  COMPLETE METABOLIC PANEL WITH GFR     Status: Abnormal   Collection Time: 06/01/20 10:32 AM  Result Value Ref Range   Glucose, Bld 128 (H) 65 - 99 mg/dL    Comment: .            Fasting reference interval . For someone without known diabetes, a glucose value >125 mg/dL indicates that they may have diabetes and this should be confirmed with a follow-up test. .    BUN 18 7 - 25 mg/dL   Creat 0.94 0.70 - 1.33 mg/dL    Comment: For patients >56 years of age, the reference limit for Creatinine is approximately 13% higher for people identified as African-American. .    GFR, Est Non African American 88 > OR = 60 mL/min/1.72m   GFR, Est African American 102 > OR = 60 mL/min/1.731m  BUN/Creatinine Ratio NOT APPLICABLE 6 - 22 (calc)   Sodium 135 135 - 146 mmol/L   Potassium 4.4 3.5 - 5.3 mmol/L   Chloride 98 98 - 110 mmol/L   CO2 26 20 - 32 mmol/L   Calcium 9.6 8.6 - 10.3 mg/dL   Total Protein 8.0 6.1 - 8.1 g/dL   Albumin 4.7 3.6 - 5.1 g/dL   Globulin 3.3 1.9 - 3.7 g/dL (calc)   AG Ratio 1.4 1.0 - 2.5 (calc)    Total Bilirubin 0.7 0.2 - 1.2 mg/dL   Alkaline phosphatase (APISO) 94 35 - 144 U/L   AST 24 10 - 35 U/L   ALT 30 9 - 46 U/L  Lipid panel     Status: Abnormal   Collection Time: 06/01/20 10:32 AM  Result Value Ref Range   Cholesterol 201 (H) <200 mg/dL   HDL 46 > OR = 40 mg/dL   Triglycerides 214 (H) <150 mg/dL    Comment: . If a non-fasting specimen was collected, consider repeat triglyceride testing on a fasting specimen if clinically indicated.  JaYates Decampt al. J. of Clin. Lipidol. [id number];0:[id number].Marland Kitchen  LDL Cholesterol (Calc) 122 (H) mg/dL (calc)    Comment: Reference range: <100 . Desirable range <100 mg/dL for primary prevention;   <70 mg/dL for patients with CHD or diabetic patients  with > or = 2 CHD risk factors. Marland Kitchen LDL-C is now calculated using the Martin-Hopkins  calculation, which is a validated novel method providing  better accuracy than the Friedewald equation in the  estimation of LDL-C.  Cresenciano Genre et al. Annamaria Helling. 5909;311(21): 2061-2068  (http://education.[facility].com/faq/FAQ164)    Total CHOL/HDL Ratio 4.4 <5.0 (calc)   Non-HDL Cholesterol (Calc) 155 (H) <130 mg/dL (calc)    Comment: For patients with diabetes plus 1 major ASCVD risk  factor, treating to a non-HDL-C goal of <100 mg/dL  (LDL-C of <70 mg/dL) is considered a therapeutic  option.   Hemoglobin A1c     Status: Abnormal   Collection Time: 06/01/20 10:32 AM  Result Value Ref Range   Hgb A1c MFr Bld 6.4 (H) <5.7 % of total Hgb    Comment: For someone without known diabetes, a hemoglobin  A1c value between 5.7% and 6.4% is consistent with prediabetes and should be confirmed with a  follow-up test. . For someone with known diabetes, a value <7% indicates that their diabetes is well controlled. A1c targets should be individualized based on duration of diabetes, age, comorbid conditions, and other considerations. . This assay result is consistent with an increased risk of  diabetes. . Currently, no consensus exists regarding use of hemoglobin A1c for diagnosis of diabetes for children. .    Mean Plasma Glucose 137 (calc)   eAG (mmol/L) 7.6 (calc)  PSA, Total with Reflex to PSA, Free     Status: None   Collection Time: 06/01/20 10:32 AM  Result Value Ref Range   PSA, Total 0.8 < OR = 4.0 ng/mL    Comment: The Total PSA value from this assay system is  standardized against the equimolar PSA standard.  The test result will be approximately 20% higher  when compared to the [facility] Total PSA  (Siemens assay). Comparison of serial PSA results  should be interpreted with this fact in mind. Marland Kitchen PSA was performed using the Beckman Coulter  Immunoassay method. Values obtained from different  assay methods cannot be used interchangeably. PSA  levels, regardless of value, should not be  interpreted as absolute evidence of the presence or  absence of disease.     No results found.     All questions at time of visit were answered - patient instructed to contact office with any additional concerns or updates.  ER/RTC precautions were reviewed with the patient as applicable.   Please note: voice recognition software was used to produce this document, and typos may escape review. Please contact Dr. Sheppard Coil for any needed clarifications.

## 2020-06-01 NOTE — Patient Instructions (Signed)
General Preventive Care  Most recent routine screening labs: ordered today.   Blood pressure goal 130/80 or less.   Tobacco: don't! Alcohol: responsible moderation is ok for most adults - if you have concerns about your alcohol intake, please talk to me!   Exercise: as tolerated to reduce risk of cardiovascular disease and diabetes. Strength training will also prevent osteoporosis.   Mental health: if need for mental health care (medicines, counseling, other), or concerns about moods, please let me know!   Sexual / Reproductive health: if need for STD testing, or if concerns with libido/pain problems, please let me know!   Advanced Directive: Living Will and/or Healthcare Power of Attorney recommended for all adults, regardless of age or health.  Vaccines  Flu vaccine: for almost everyone, every fall.   Shingles vaccine: after age 51.   Pneumonia vaccines: after age 46  Tetanus booster: every 10 years   COVID vaccine: STRONGLY RECOMMENDED  Cancer screenings   Colon cancer screening: for everyone age 13-75. Colonoscopy available for all, many people also qualify for the Cologuard stool test   Prostate cancer screening: PSA blood test age 74-71 Infection screenings  . HIV: recommended screening at least once age 92-65 . Gonorrhea/Chlamydia: screening as needed . Hepatitis C: recommended once for everyone age 29-75 . TB: certain at-risk populations

## 2020-06-02 LAB — LIPID PANEL
Cholesterol: 201 mg/dL — ABNORMAL HIGH (ref <200)
HDL: 46 mg/dL (ref >=40)
LDL Cholesterol (Calc): 122 mg/dL (calc) — ABNORMAL HIGH
Non-HDL Cholesterol (Calc): 155 mg/dL (calc) — ABNORMAL HIGH (ref <130)
Total CHOL/HDL Ratio: 4.4 (calc) (ref <5.0)
Triglycerides: 214 mg/dL — ABNORMAL HIGH (ref <150)

## 2020-06-02 LAB — CBC
HCT: 51.8 % — ABNORMAL HIGH (ref 38.5–50.0)
Hemoglobin: 17.3 g/dL — ABNORMAL HIGH (ref 13.2–17.1)
MCH: 31.1 pg (ref 27.0–33.0)
MCHC: 33.4 g/dL (ref 32.0–36.0)
MCV: 93.2 fL (ref 80.0–100.0)
MPV: 13.9 fL — ABNORMAL HIGH (ref 7.5–12.5)
Platelets: 264 10*3/uL (ref 140–400)
RBC: 5.56 10*6/uL (ref 4.20–5.80)
RDW: 13 % (ref 11.0–15.0)
WBC: 8.2 10*3/uL (ref 3.8–10.8)

## 2020-06-02 LAB — COMPLETE METABOLIC PANEL WITH GFR
AG Ratio: 1.4 (calc) (ref 1.0–2.5)
ALT: 30 U/L (ref 9–46)
AST: 24 U/L (ref 10–35)
Albumin: 4.7 g/dL (ref 3.6–5.1)
Alkaline phosphatase (APISO): 94 U/L (ref 35–144)
BUN: 18 mg/dL (ref 7–25)
CO2: 26 mmol/L (ref 20–32)
Calcium: 9.6 mg/dL (ref 8.6–10.3)
Chloride: 98 mmol/L (ref 98–110)
Creat: 0.94 mg/dL (ref 0.70–1.33)
GFR, Est African American: 102 mL/min/{1.73_m2} (ref >=60)
GFR, Est Non African American: 88 mL/min/{1.73_m2} (ref >=60)
Globulin: 3.3 g/dL (calc) (ref 1.9–3.7)
Glucose, Bld: 128 mg/dL — ABNORMAL HIGH (ref 65–99)
Potassium: 4.4 mmol/L (ref 3.5–5.3)
Sodium: 135 mmol/L (ref 135–146)
Total Bilirubin: 0.7 mg/dL (ref 0.2–1.2)
Total Protein: 8 g/dL (ref 6.1–8.1)

## 2020-06-02 LAB — HEMOGLOBIN A1C
Hgb A1c MFr Bld: 6.4 % of total Hgb — ABNORMAL HIGH (ref <5.7)
Mean Plasma Glucose: 137 (calc)
eAG (mmol/L): 7.6 (calc)

## 2020-06-02 LAB — PSA, TOTAL WITH REFLEX TO PSA, FREE: PSA, Total: 0.8 ng/mL (ref <=4.0)

## 2020-08-15 ENCOUNTER — Telehealth: Payer: Self-pay

## 2020-08-15 DIAGNOSIS — Z1211 Encounter for screening for malignant neoplasm of colon: Secondary | ICD-10-CM

## 2020-08-15 NOTE — Telephone Encounter (Signed)
Referral to GI for a Colonoscopy.

## 2020-09-05 ENCOUNTER — Telehealth: Payer: Self-pay | Admitting: Gastroenterology

## 2020-09-07 NOTE — Telephone Encounter (Signed)
Colonoscopy report received, reviewed and n/f the following:  - Colonsocopy (01/29/2013, Dr. Alonza Bogus, [place]): Mild pandiverticulosis, 4 mm polyp (tubular adenoma) resected from hepatic flexure with hot biopsy forceps. Normal TI.   Recommended repeat in 5 years.   Based on prior colonoscopy results, previous Endscopist's recommendations, interval since last colonoscopy, and guideline recommendations at that time, plan for repeat colonoscopy now for continued polyp surveillance. Ok to set up for direct access with me in Center.

## 2020-09-13 NOTE — Telephone Encounter (Signed)
Spoke w/patient.  Will call back and schedule direct colon per Dr. Bryan Lemma.  Records at [place]

## 2020-09-29 ENCOUNTER — Encounter: Payer: Self-pay | Admitting: Gastroenterology

## 2020-10-04 ENCOUNTER — Other Ambulatory Visit: Payer: Self-pay | Admitting: Medical-Surgical

## 2020-10-04 DIAGNOSIS — I10 Essential (primary) hypertension: Secondary | ICD-10-CM

## 2020-10-04 NOTE — Telephone Encounter (Signed)
Routing to PCP

## 2020-10-12 ENCOUNTER — Other Ambulatory Visit: Payer: Self-pay | Admitting: Medical-Surgical

## 2020-10-12 DIAGNOSIS — E119 Type 2 diabetes mellitus without complications: Secondary | ICD-10-CM

## 2020-11-04 ENCOUNTER — Ambulatory Visit (AMBULATORY_SURGERY_CENTER): Payer: Self-pay

## 2020-11-04 ENCOUNTER — Other Ambulatory Visit: Payer: Self-pay

## 2020-11-04 VITALS — Ht 67.0 in | Wt 135.0 lb

## 2020-11-04 DIAGNOSIS — Z86010 Personal history of colonic polyps: Secondary | ICD-10-CM

## 2020-11-04 NOTE — Progress Notes (Signed)
No allergies to soy or egg Pt is not on blood thinners or diet pills   Issues with sedation/intubation not assessed, pt has only had colonoscopy  Denies atrial flutter/fib Denies constipation     Pt is aware of Covid safety and care partner requirements.

## 2020-11-23 ENCOUNTER — Encounter: Payer: Self-pay | Admitting: Internal Medicine

## 2020-11-30 ENCOUNTER — Encounter: Payer: Self-pay | Admitting: Osteopathic Medicine

## 2020-11-30 ENCOUNTER — Ambulatory Visit (INDEPENDENT_AMBULATORY_CARE_PROVIDER_SITE_OTHER): Payer: BC Managed Care – PPO | Admitting: Osteopathic Medicine

## 2020-11-30 ENCOUNTER — Other Ambulatory Visit: Payer: Self-pay

## 2020-11-30 VITALS — BP 123/80 | HR 73 | Temp 97.5°F | Wt 134.0 lb

## 2020-11-30 DIAGNOSIS — R634 Abnormal weight loss: Secondary | ICD-10-CM | POA: Diagnosis not present

## 2020-11-30 DIAGNOSIS — E782 Mixed hyperlipidemia: Secondary | ICD-10-CM | POA: Diagnosis not present

## 2020-11-30 DIAGNOSIS — E119 Type 2 diabetes mellitus without complications: Secondary | ICD-10-CM

## 2020-11-30 DIAGNOSIS — I10 Essential (primary) hypertension: Secondary | ICD-10-CM | POA: Diagnosis not present

## 2020-11-30 LAB — POCT GLYCOSYLATED HEMOGLOBIN (HGB A1C): Hemoglobin A1C: 6.4 % — AB (ref 4.0–5.6)

## 2020-11-30 NOTE — Patient Instructions (Signed)
Plan: Weight loss: Labs today If labs all normal will get CT scan of the body to look for possible cancer.  If labs and CT are normal, will check weight again in a few months

## 2020-11-30 NOTE — Progress Notes (Signed)
Nicholas Salazar is a 60 y.o. male who presents to  [place] at [facility]  today, 11/30/20, seeking care for the following:  . Follow up diabetes: A1C today 6.4, stable from 6.4 past 6 and 12 mos. Doing well on dapagliflozin-metformin 12-998 mg daily . Follow up HTN: BP today at goal, doing well on Losartan 25 mg daily . Has declined statin Rx  . Unintentional weight loss and (+)fatigue, no night sweats, no fevers, no nausea/vomiging, no bleeding  BP Readings from Last 3 Encounters:  11/30/20 123/80  06/01/20 (!) 135/92  03/11/19 111/79        ASSESSMENT & PLAN with other pertinent findings:  The primary encounter diagnosis was Diabetes mellitus without complication ([place]). Diagnoses of Essential hypertension, Mixed hyperlipidemia, and Unintended weight loss were also pertinent to this visit.    Patient Instructions  Plan: Weight loss: Labs today If labs all normal will get CT scan of the body to look for possible cancer.  If labs and CT are normal, will check weight again in a few months    Orders Placed This Encounter  Procedures  . CBC  . COMPLETE METABOLIC PANEL WITH GFR  . Lipid panel  . TSH  . PSA, Total with Reflex to PSA, Free  . HIV Antibody (routine testing w rflx)  . Hepatitis C antibody  . Urinalysis, Routine w reflex microscopic  . POCT HgB A1C    No orders of the defined types were placed in this encounter.    See below for relevant physical exam findings  See below for recent lab and imaging results reviewed  Medications, allergies, PMH, PSH, SocH, [place] reviewed below    Follow-up instructions: Return in about 6 months (around 06/01/2021) for RECHECK PENDING RESULTS / IF WORSE OR CHANGE.                                        Exam:  BP 123/80 (BP Location: Left Arm, Patient Position: Sitting, Cuff Size: Normal)   Pulse 73   Temp (!) 97.5 F (36.4 C) (Oral)   Wt  134 lb 0.6 oz (60.8 kg)   BMI 20.99 kg/m   Constitutional: VS see above. General Appearance: alert, well-developed, well-nourished, NAD  Neck: No masses, trachea midline.   Respiratory: Normal respiratory effort. no wheeze, no rhonchi, no rales  Cardiovascular: S1/S2 normal, no murmur, no rub/gallop auscultated. RRR.   Musculoskeletal: Gait normal. Symmetric and independent movement of all extremities  Abdominal: non-tender, non-distended, no appreciable organomegaly, neg Murphy's, BS WNLx4  Neurological: Normal balance/coordination. No tremor.  Skin: warm, dry, intact.   Psychiatric: Normal judgment/insight. Normal mood and affect. Oriented x3.   Current Meds  Medication Sig  . Dapagliflozin-metFORMIN HCl ER (XIGDUO XR) 12-998 MG TB24 Take 1 tablet by mouth once daily.   Marland Kitchen losartan (COZAAR) 25 MG tablet Take 1 tablet by mouth once daily    Allergies  Allergen Reactions  . Lisinopril Cough    Patient Active Problem List   Diagnosis Date Noted  . Chronic pain of left knee 12/13/2017  . Chronic left shoulder pain 12/13/2017  . History of colon polyps 12/13/2017  . GERD (gastroesophageal reflux disease) 08/09/2016  . Influenza vaccination declined 08/19/2015  . Anxiety state 08/19/2015  . Diabetes mellitus without complication ([place]) 19/41/7408  . Hyperlipidemia 05/16/2015  . Other fatigue 05/16/2015  . Polycythemia  05/16/2015  . Vitamin D deficiency 05/16/2015  . Erectile dysfunction 05/16/2015  . Essential hypertension 05/16/2015    Family History  Problem Relation Age of Onset  . Colon cancer Neg Hx   . Colon polyps Neg Hx   . Esophageal cancer Neg Hx   . Rectal cancer Neg Hx   . Stomach cancer Neg Hx     Social History   Tobacco Use  Smoking Status Never Smoker  Smokeless Tobacco Never Used    Past Surgical History:  Procedure Laterality Date  . COLONOSCOPY  2014    Immunization History  Administered Date(s) Administered  .  Influenza-Unspecified 06/13/2014  . Pneumococcal Polysaccharide-23 11/12/2016    Recent Results (from the past 2160 hour(s))  POCT HgB A1C     Status: Abnormal   Collection Time: 11/30/20 10:12 AM  Result Value Ref Range   Hemoglobin A1C 6.4 (A) 4.0 - 5.6 %   HbA1c POC (<> result, manual entry)     HbA1c, POC (prediabetic range)     HbA1c, POC (controlled diabetic range)    CBC     Status: Abnormal   Collection Time: 11/30/20 10:40 AM  Result Value Ref Range   WBC 6.7 3.8 - 10.8 Thousand/uL   RBC 5.44 4.20 - 5.80 Million/uL   Hemoglobin 17.0 13.2 - 17.1 g/dL   HCT 50.7 (H) 38.5 - 50.0 %   MCV 93.2 80.0 - 100.0 fL   MCH 31.3 27.0 - 33.0 pg   MCHC 33.5 32.0 - 36.0 g/dL   RDW 13.0 11.0 - 15.0 %   Platelets 359 140 - 400 Thousand/uL   MPV 11.1 7.5 - 12.5 fL  COMPLETE METABOLIC PANEL WITH GFR     Status: Abnormal   Collection Time: 11/30/20 10:40 AM  Result Value Ref Range   Glucose, Bld 117 (H) 65 - 99 mg/dL    Comment: .            Fasting reference interval . For someone without known diabetes, a glucose value between 100 and 125 mg/dL is consistent with prediabetes and should be confirmed with a follow-up test. .    BUN 11 7 - 25 mg/dL   Creat 0.90 0.70 - 1.25 mg/dL    Comment: For patients >31 years of age, the reference limit for Creatinine is approximately 13% higher for people identified as African-American. .    GFR, Est Non African American 93 > OR = 60 mL/min/1.70m2   GFR, Est African American 107 > OR = 60 mL/min/1.21m2   BUN/Creatinine Ratio NOT APPLICABLE 6 - 22 (calc)   Sodium 137 135 - 146 mmol/L   Potassium 5.0 3.5 - 5.3 mmol/L   Chloride 99 98 - 110 mmol/L   CO2 27 20 - 32 mmol/L   Calcium 9.6 8.6 - 10.3 mg/dL   Total Protein 7.6 6.1 - 8.1 g/dL   Albumin 4.4 3.6 - 5.1 g/dL   Globulin 3.2 1.9 - 3.7 g/dL (calc)   AG Ratio 1.4 1.0 - 2.5 (calc)   Total Bilirubin 0.9 0.2 - 1.2 mg/dL   Alkaline phosphatase (APISO) 103 35 - 144 U/L   AST 22 10 - 35 U/L    ALT 17 9 - 46 U/L  Lipid panel     Status: Abnormal   Collection Time: 11/30/20 10:40 AM  Result Value Ref Range   Cholesterol 193 <200 mg/dL   HDL 44 > OR = 40 mg/dL   Triglycerides 207 (H) <150 mg/dL  Comment: . If a non-fasting specimen was collected, consider repeat triglyceride testing on a fasting specimen if clinically indicated.  Yates Decamp et al. J. of Clin. Lipidol. 3154;0:086-761. Marland Kitchen    LDL Cholesterol (Calc) 117 (H) mg/dL (calc)    Comment: Reference range: <100 . Desirable range <100 mg/dL for primary prevention;   <70 mg/dL for patients with CHD or diabetic patients  with > or = 2 CHD risk factors. Marland Kitchen LDL-C is now calculated using the Martin-Hopkins  calculation, which is a validated novel method providing  better accuracy than the Friedewald equation in the  estimation of LDL-C.  Cresenciano Genre et al. Annamaria Helling. 9509;326(71): 2061-2068  (http://education.[facility].com/faq/FAQ164)    Total CHOL/HDL Ratio 4.4 <5.0 (calc)   Non-HDL Cholesterol (Calc) 149 (H) <130 mg/dL (calc)    Comment: For patients with diabetes plus 1 major ASCVD risk  factor, treating to a non-HDL-C goal of <100 mg/dL  (LDL-C of <70 mg/dL) is considered a therapeutic  option.   TSH     Status: None   Collection Time: 11/30/20 10:40 AM  Result Value Ref Range   TSH 1.18 0.40 - 4.50 mIU/L  HIV Antibody (routine testing w rflx)     Status: None   Collection Time: 11/30/20 10:40 AM  Result Value Ref Range   HIV 1&2 Ab, 4th Generation NON-REACTIVE NON-REACTI    Comment: HIV-1 antigen and HIV-1/HIV-2 antibodies were not detected. There is no laboratory evidence of HIV infection. Marland Kitchen PLEASE NOTE: This information has been disclosed to you from records whose confidentiality may be protected by state law.  If your state requires such protection, then the state law prohibits you from making any further disclosure of the information without the specific written consent of the person to whom it  pertains, or as otherwise permitted by law. A general authorization for the release of medical or other information is NOT sufficient for this purpose. . For additional information please refer to http://education.quest[facility].com/faq/FAQ106 (This link is being provided for informational/ educational purposes only.) . Marland Kitchen The performance of this assay has not been clinically validated in patients less than 44 years old. Marland Kitchen   Urinalysis, Routine w reflex microscopic     Status: Abnormal   Collection Time: 11/30/20 10:40 AM  Result Value Ref Range   Color, Urine YELLOW YELLOW   APPearance CLEAR CLEAR   Specific Gravity, Urine 1.014 1.001 - 1.03   pH 7.0 5.0 - 8.0   Glucose, UA 3+ (A) NEGATIVE   Bilirubin Urine NEGATIVE NEGATIVE   Ketones, ur NEGATIVE NEGATIVE   Hgb urine dipstick NEGATIVE NEGATIVE   Protein, ur NEGATIVE NEGATIVE   Nitrite NEGATIVE NEGATIVE   Leukocytes,Ua NEGATIVE NEGATIVE    No results found.     All questions at time of visit were answered - patient instructed to contact office with any additional concerns or updates. ER/RTC precautions were reviewed with the patient as applicable.   Please note: manual typing as well as voice recognition software may have been used to produce this document - typos may escape review. Please contact Dr. Sheppard Coil for any needed clarifications.

## 2020-12-01 LAB — TSH: TSH: 1.18 mIU/L (ref 0.40–4.50)

## 2020-12-01 LAB — COMPLETE METABOLIC PANEL WITH GFR
AG Ratio: 1.4 (calc) (ref 1.0–2.5)
ALT: 17 U/L (ref 9–46)
AST: 22 U/L (ref 10–35)
Albumin: 4.4 g/dL (ref 3.6–5.1)
Alkaline phosphatase (APISO): 103 U/L (ref 35–144)
BUN: 11 mg/dL (ref 7–25)
CO2: 27 mmol/L (ref 20–32)
Calcium: 9.6 mg/dL (ref 8.6–10.3)
Chloride: 99 mmol/L (ref 98–110)
Creat: 0.9 mg/dL (ref 0.70–1.25)
GFR, Est African American: 107 mL/min/{1.73_m2} (ref >=60)
GFR, Est Non African American: 93 mL/min/{1.73_m2} (ref >=60)
Globulin: 3.2 g/dL (calc) (ref 1.9–3.7)
Glucose, Bld: 117 mg/dL — ABNORMAL HIGH (ref 65–99)
Potassium: 5 mmol/L (ref 3.5–5.3)
Sodium: 137 mmol/L (ref 135–146)
Total Bilirubin: 0.9 mg/dL (ref 0.2–1.2)
Total Protein: 7.6 g/dL (ref 6.1–8.1)

## 2020-12-01 LAB — URINALYSIS, ROUTINE W REFLEX MICROSCOPIC
Bilirubin Urine: NEGATIVE
Hgb urine dipstick: NEGATIVE
Ketones, ur: NEGATIVE
Leukocytes,Ua: NEGATIVE
Nitrite: NEGATIVE
Protein, ur: NEGATIVE
Specific Gravity, Urine: 1.014 (ref 1.001–1.03)
pH: 7 (ref 5.0–8.0)

## 2020-12-01 LAB — CBC
HCT: 50.7 % — ABNORMAL HIGH (ref 38.5–50.0)
Hemoglobin: 17 g/dL (ref 13.2–17.1)
MCH: 31.3 pg (ref 27.0–33.0)
MCHC: 33.5 g/dL (ref 32.0–36.0)
MCV: 93.2 fL (ref 80.0–100.0)
MPV: 11.1 fL (ref 7.5–12.5)
Platelets: 359 10*3/uL (ref 140–400)
RBC: 5.44 10*6/uL (ref 4.20–5.80)
RDW: 13 % (ref 11.0–15.0)
WBC: 6.7 10*3/uL (ref 3.8–10.8)

## 2020-12-01 LAB — LIPID PANEL
Cholesterol: 193 mg/dL (ref <200)
HDL: 44 mg/dL (ref >=40)
LDL Cholesterol (Calc): 117 mg/dL (calc) — ABNORMAL HIGH
Non-HDL Cholesterol (Calc): 149 mg/dL (calc) — ABNORMAL HIGH (ref <130)
Total CHOL/HDL Ratio: 4.4 (calc) (ref <5.0)
Triglycerides: 207 mg/dL — ABNORMAL HIGH (ref <150)

## 2020-12-01 LAB — HEPATITIS C ANTIBODY
Hepatitis C Ab: NONREACTIVE
SIGNAL TO CUT-OFF: 0.01 (ref <1.00)

## 2020-12-01 LAB — PSA, TOTAL WITH REFLEX TO PSA, FREE: PSA, Total: 1.3 ng/mL (ref <=4.0)

## 2020-12-01 LAB — HIV ANTIBODY (ROUTINE TESTING W REFLEX): HIV 1&2 Ab, 4th Generation: NONREACTIVE

## 2020-12-02 ENCOUNTER — Other Ambulatory Visit: Payer: Self-pay

## 2020-12-02 ENCOUNTER — Ambulatory Visit (AMBULATORY_SURGERY_CENTER): Payer: BC Managed Care – PPO | Admitting: Gastroenterology

## 2020-12-02 ENCOUNTER — Encounter: Payer: Self-pay | Admitting: Gastroenterology

## 2020-12-02 VITALS — BP 118/79 | HR 84 | Temp 98.2°F | Resp 14 | Ht 67.0 in | Wt 135.0 lb

## 2020-12-02 DIAGNOSIS — D124 Benign neoplasm of descending colon: Secondary | ICD-10-CM

## 2020-12-02 DIAGNOSIS — Z86010 Personal history of colonic polyps: Secondary | ICD-10-CM | POA: Diagnosis not present

## 2020-12-02 DIAGNOSIS — D123 Benign neoplasm of transverse colon: Secondary | ICD-10-CM

## 2020-12-02 DIAGNOSIS — K640 First degree hemorrhoids: Secondary | ICD-10-CM

## 2020-12-02 DIAGNOSIS — K5730 Diverticulosis of large intestine without perforation or abscess without bleeding: Secondary | ICD-10-CM

## 2020-12-02 MED ORDER — SODIUM CHLORIDE 0.9 % IV SOLN: 500.0000 mL | Freq: Once | INTRAVENOUS | Status: DC

## 2020-12-02 NOTE — Patient Instructions (Signed)
YOU HAD AN ENDOSCOPIC PROCEDURE TODAY AT THE [facility]:   Refer to the procedure report that was given to you for any specific questions about what was found during the examination.  If the procedure report does not answer your questions, please call your gastroenterologist to clarify.  If you requested that your care partner not be given the details of your procedure findings, then the procedure report has been included in a sealed envelope for you to review at your convenience later.  YOU SHOULD EXPECT: Some feelings of bloating in the abdomen. Passage of more gas than usual.  Walking can help get rid of the air that was put into your GI tract during the procedure and reduce the bloating. If you had a lower endoscopy (such as a colonoscopy or flexible sigmoidoscopy) you may notice spotting of blood in your stool or on the toilet paper. If you underwent a bowel prep for your procedure, you may not have a normal bowel movement for a few days.  Please Note:  You might notice some irritation and congestion in your nose or some drainage.  This is from the oxygen used during your procedure.  There is no need for concern and it should clear up in a day or so.  SYMPTOMS TO REPORT IMMEDIATELY:   Following lower endoscopy (colonoscopy or flexible sigmoidoscopy):  Excessive amounts of blood in the stool  Significant tenderness or worsening of abdominal pains  Swelling of the abdomen that is new, acute  Fever of 100F or higher   Following upper endoscopy (EGD)  Vomiting of blood or coffee ground material  New chest pain or pain under the shoulder blades  Painful or persistently difficult swallowing  New shortness of breath  Fever of 100F or higher  Black, tarry-looking stools  For urgent or emergent issues, a gastroenterologist can be reached at any hour by calling [phone]. Do not use MyChart messaging for urgent concerns.    DIET:  We do recommend a small meal at first, but  then you may proceed to your regular diet.  Drink plenty of fluids but you should avoid alcoholic beverages for 24 hours.  ACTIVITY:  You should plan to take it easy for the rest of today and you should NOT DRIVE or use heavy machinery until tomorrow (because of the sedation medicines used during the test).    FOLLOW UP: Our staff will call the number listed on your records 48-72 hours following your procedure to check on you and address any questions or concerns that you may have regarding the information given to you following your procedure. If we do not reach you, we will leave a message.  We will attempt to reach you two times.  During this call, we will ask if you have developed any symptoms of COVID 19. If you develop any symptoms (ie: fever, flu-like symptoms, shortness of breath, cough etc.) before then, please call [phone].  If you test positive for Covid 19 in the 2 weeks post procedure, please call and report this information to us.    If any biopsies were taken you will be contacted by phone or by letter within the next 1-3 weeks.  Please call us at [phone] if you have not heard about the biopsies in 3 weeks.    SIGNATURES/CONFIDENTIALITY: You and/or your care partner have signed paperwork which will be entered into your electronic medical record.  These signatures attest to the fact that that the information above on   your After Visit Summary has been reviewed and is understood.  Full responsibility of the confidentiality of this discharge information lies with you and/or your care-partner. 

## 2020-12-02 NOTE — Progress Notes (Signed)
Called to room to assist during endoscopic procedure.  Patient ID and intended procedure confirmed with present staff. Received instructions for my participation in the procedure from the performing physician.  

## 2020-12-02 NOTE — Progress Notes (Signed)
7416 Ephedrine 10 mg given IV due to low BP, MD updated.

## 2020-12-02 NOTE — Progress Notes (Signed)
VS by CW  Pt's states no medical or surgical changes since previsit or office visit.  

## 2020-12-02 NOTE — Op Note (Signed)
[facility] Patient Name: Nicholas Salazar Procedure Date: 12/02/2020 8:01 AM MRN: [id number] Endoscopist: Doristine Locks , MD Age: 60 Referring MD:  Date of Birth: 02-28-1961 Gender: Male Account #: [id number] Procedure:                Colonoscopy Indications:              Surveillance: Personal history of adenomatous                            polyps on last colonoscopy > 5 years ago                           - Colonoscopy in 01/2013 with 4 mm Tubular Adenoma                            in hepatic flexure. Otherwise, no active GI                            symptoms and no known family history of colon                            cancer. Medicines:                Monitored Anesthesia Care Procedure:                Pre-Anesthesia Assessment:                           - Prior to the procedure, a History and Physical                            was performed, and patient medications and                            allergies were reviewed. The patient's tolerance of                            previous anesthesia was also reviewed. The risks                            and benefits of the procedure and the sedation                            options and risks were discussed with the patient.                            All questions were answered, and informed consent                            was obtained. Prior Anticoagulants: The patient has                            taken no previous anticoagulant or antiplatelet  agents. ASA Grade Assessment: II - A patient with                            mild systemic disease. After reviewing the risks                            and benefits, the patient was deemed in                            satisfactory condition to undergo the procedure.                           After obtaining informed consent, the colonoscope                            was passed under direct vision. Throughout the                             procedure, the patient's blood pressure, pulse, and                            oxygen saturations were monitored continuously. The                            Olympus PCF-H190DL ([id number]) Colonoscope was                            introduced through the anus and advanced to the the                            terminal ileum. The colonoscopy was performed                            without difficulty. The patient tolerated the                            procedure well. The quality of the bowel                            preparation was good. The terminal ileum, ileocecal                            valve, appendiceal orifice, and rectum were                            photographed. Scope In: 8:02:57 AM Scope Out: 8:21:28 AM Scope Withdrawal Time: 0 hours 15 minutes 31 seconds  Total Procedure Duration: 0 hours 18 minutes 31 seconds  Findings:                 The perianal and digital rectal examinations were                            normal.  Three sessile polyps were found in the descending                            colon, transverse colon and hepatic flexure. The                            polyps were 2 to 4 mm in size. These polyps were                            removed with a cold snare. Resection and retrieval                            were complete. Estimated blood loss was minimal.                           Multiple small and large-mouthed diverticula were                            found in the sigmoid colon, transverse colon,                            ascending colon and cecum.                           Non-bleeding internal hemorrhoids were found during                            retroflexion. The hemorrhoids were small and Grade                            I (internal hemorrhoids that do not prolapse).                           The terminal ileum appeared normal. Complications:            No immediate complications. Estimated Blood Loss:      Estimated blood loss was minimal. Impression:               - Three 2 to 4 mm polyps in the descending colon,                            in the transverse colon and at the hepatic flexure,                            removed with a cold snare. Resected and retrieved.                           - Diverticulosis in the sigmoid colon, in the                            transverse colon, in the ascending colon and in the                            cecum.                           -  Non-bleeding internal hemorrhoids.                           - The examined portion of the ileum was normal. Recommendation:           - Patient has a contact number available for                            emergencies. The signs and symptoms of potential                            delayed complications were discussed with the                            patient. Return to normal activities tomorrow.                            Written discharge instructions were provided to the                            patient.                           - Resume previous diet.                           - Continue present medications.                           - Await pathology results.                           - Repeat colonoscopy for surveillance based on                            pathology results.                           - Return to GI office PRN.                           - Use fiber, for example Citrucel, Fibercon, Konsyl                            or Metamucil. Doristine Locks, MD 12/02/2020 8:30:00 AM

## 2020-12-02 NOTE — Progress Notes (Signed)
Report given to PACU, vss 

## 2020-12-06 ENCOUNTER — Telehealth: Payer: Self-pay | Admitting: *Deleted

## 2020-12-06 ENCOUNTER — Telehealth: Payer: Self-pay

## 2020-12-06 NOTE — Telephone Encounter (Signed)
  Follow up Call-  Call back number 12/02/2020  Post procedure Call Back phone  # [phone]  Permission to leave phone message Yes  Some recent data might be hidden     Patient questions:  Do you have a fever, pain , or abdominal swelling? No. Pain Score  0 *  Have you tolerated food without any problems? Yes.    Have you been able to return to your normal activities? Yes.    Do you have any questions about your discharge instructions: Diet   No. Medications  No. Follow up visit  No.  Do you have questions or concerns about your Care? No.  Actions: * If pain score is 4 or above: No action needed, pain <4.  1. Have you developed a fever since your procedure? no  2.   Have you had an respiratory symptoms (SOB or cough) since your procedure? no  3.   Have you tested positive for COVID 19 since your procedure no  4.   Have you had any family members/close contacts diagnosed with the COVID 19 since your procedure?  no   If yes to any of these questions please route to Joylene John, RN and Joella Prince, RN

## 2020-12-06 NOTE — Telephone Encounter (Signed)
I tried to reach pt at [phone] for a follow up call post-procedure.  I left a message on pt's answering machine we will try an reach him this afternoon. maw

## 2020-12-13 ENCOUNTER — Other Ambulatory Visit: Payer: Self-pay

## 2020-12-13 ENCOUNTER — Ambulatory Visit (INDEPENDENT_AMBULATORY_CARE_PROVIDER_SITE_OTHER): Payer: BC Managed Care – PPO

## 2020-12-13 DIAGNOSIS — R634 Abnormal weight loss: Secondary | ICD-10-CM

## 2020-12-13 MED ORDER — IOHEXOL 300 MG/ML  SOLN
100.0000 mL | Freq: Once | INTRAMUSCULAR | Status: AC | PRN
  Administered 2020-12-13: 100 mL via INTRAVENOUS

## 2020-12-15 ENCOUNTER — Encounter: Payer: Self-pay | Admitting: Gastroenterology

## 2021-03-17 ENCOUNTER — Other Ambulatory Visit: Payer: Self-pay | Admitting: Medical-Surgical

## 2021-03-17 DIAGNOSIS — E119 Type 2 diabetes mellitus without complications: Secondary | ICD-10-CM

## 2021-06-15 ENCOUNTER — Other Ambulatory Visit: Payer: Self-pay | Admitting: Medical-Surgical

## 2021-06-15 DIAGNOSIS — E119 Type 2 diabetes mellitus without complications: Secondary | ICD-10-CM

## 2021-08-12 IMAGING — CT CT ABD-PELV W/ CM
2 of 5 series · 14 of 46 positions shown, 16 images · IV contrast (APPLIED)
Comparison: None.

CLINICAL DATA: Weight loss.  Unintended

EXAM:
CT CHEST without contrast and CT ABDOMEN, AND PELVIS WITH CONTRAST
TECHNIQUE: Multidetector CT imaging of the chest was performed without
contrast. CT abdomen and pelvis was performed following the standard
protocol during bolus administration of intravenous contrast.
CONTRAST:  100mL OMNIPAQUE IOHEXOL 300 MG/ML  SOLN

[Series 2: axial st · axial · 0.62mm/px · z∈[-501,-41]mm · 11 of 104 slices shown, 13 images]
[im 6/104  soft-tissue]
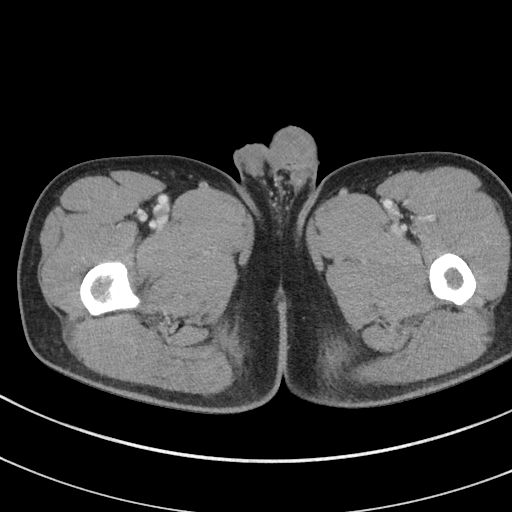
[im 6/104  bone]
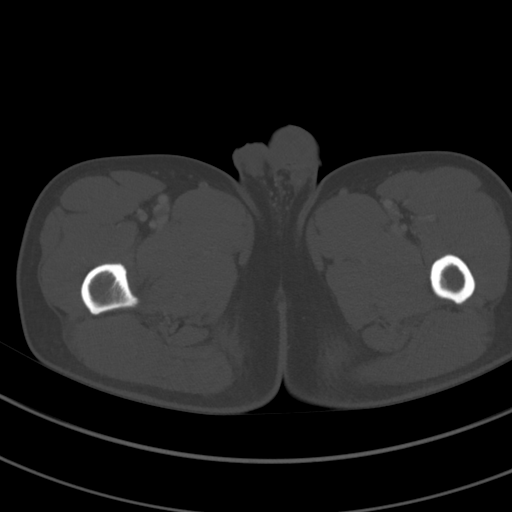
[im 17/104  soft-tissue]
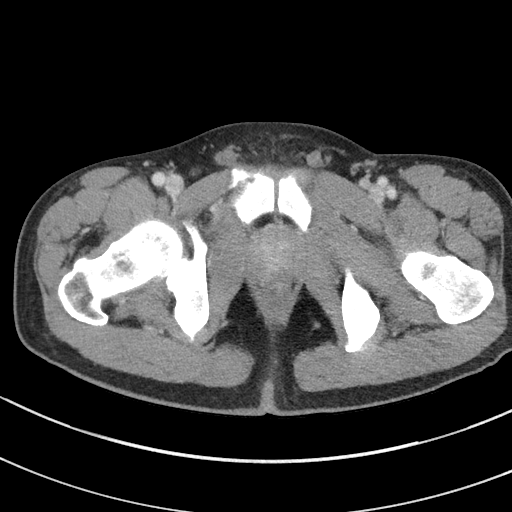
[im 28/104  soft-tissue]
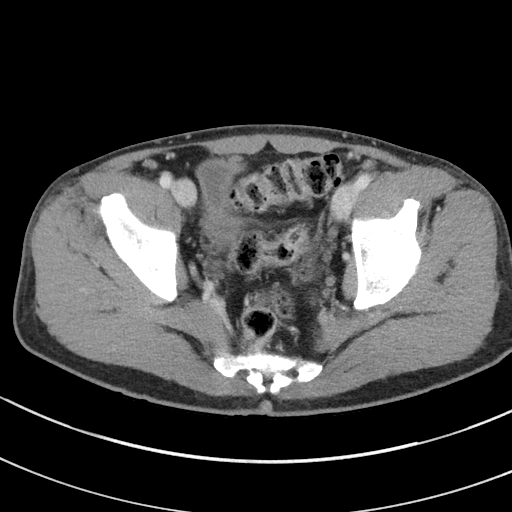
[im 33/104  soft-tissue]
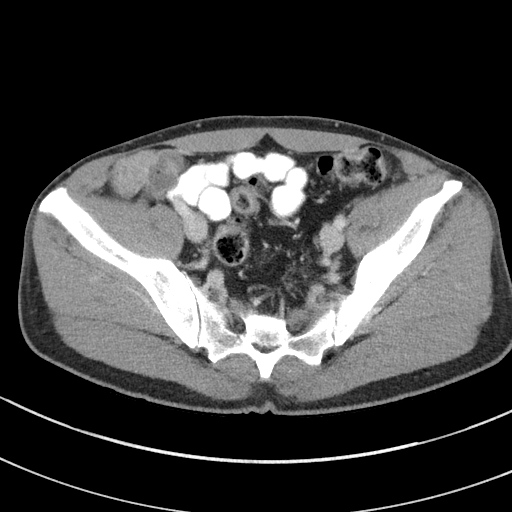
[im 44/104  soft-tissue]
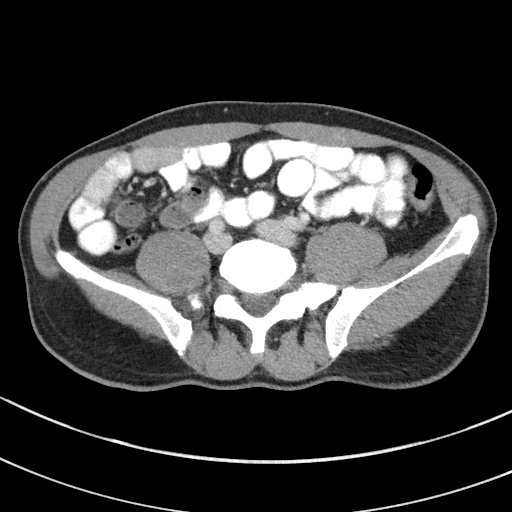
[im 55/104  soft-tissue]
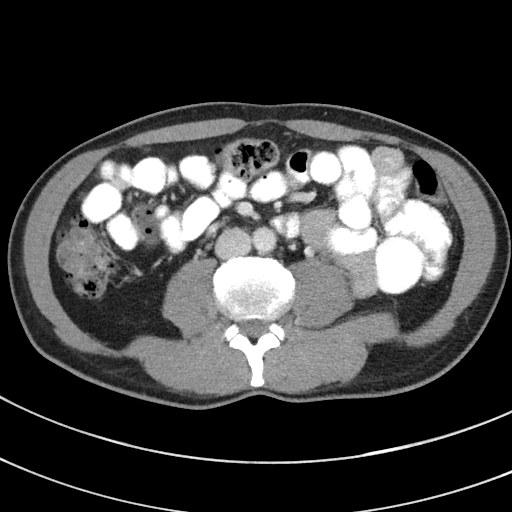
[im 60/104  soft-tissue]
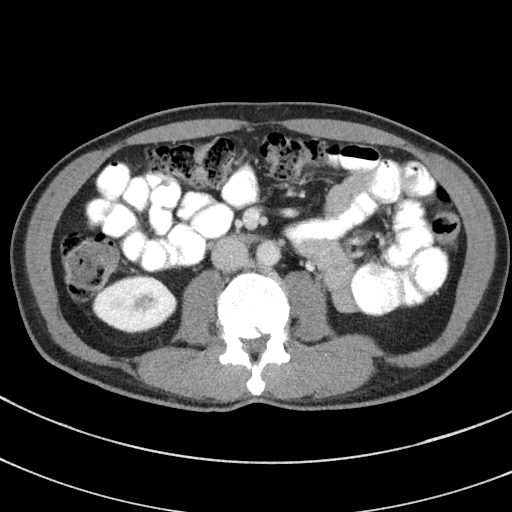
[im 71/104  soft-tissue]
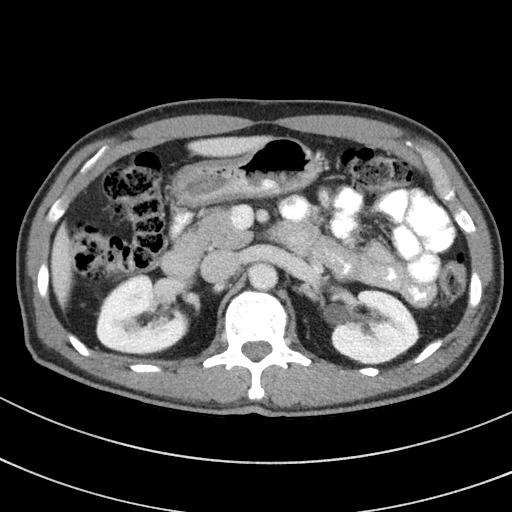
[im 76/104  soft-tissue]
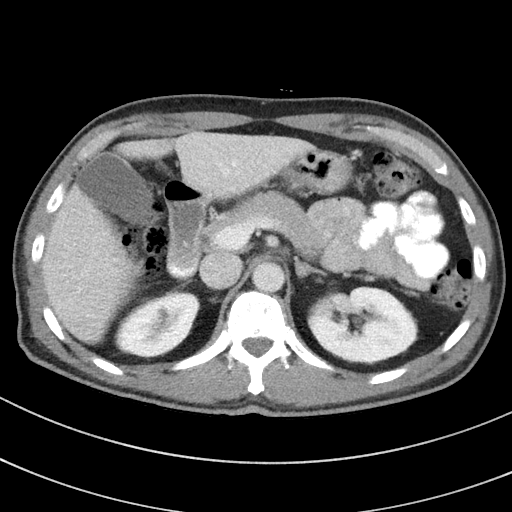
[im 76/104  bone]
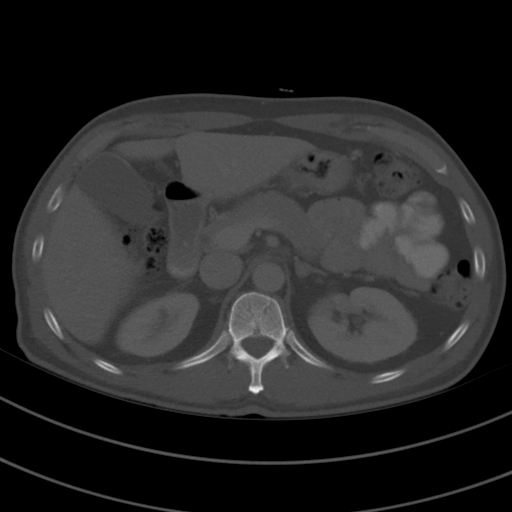
[im 87/104  soft-tissue]
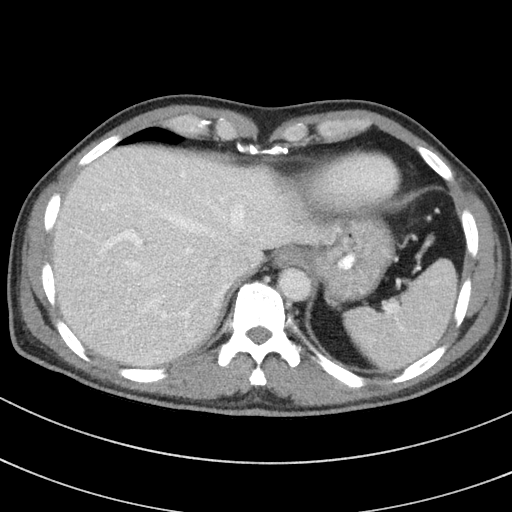
[im 98/104  soft-tissue]
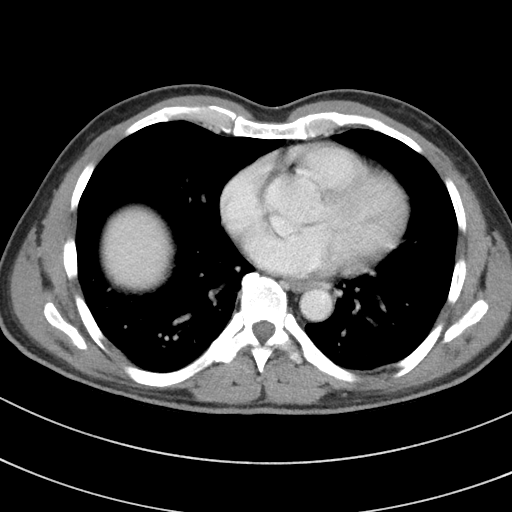

[Series 5: coronal st · coronal · 0.61mm/px · 3 of 69 slices shown]
[im 23/69  soft-tissue]
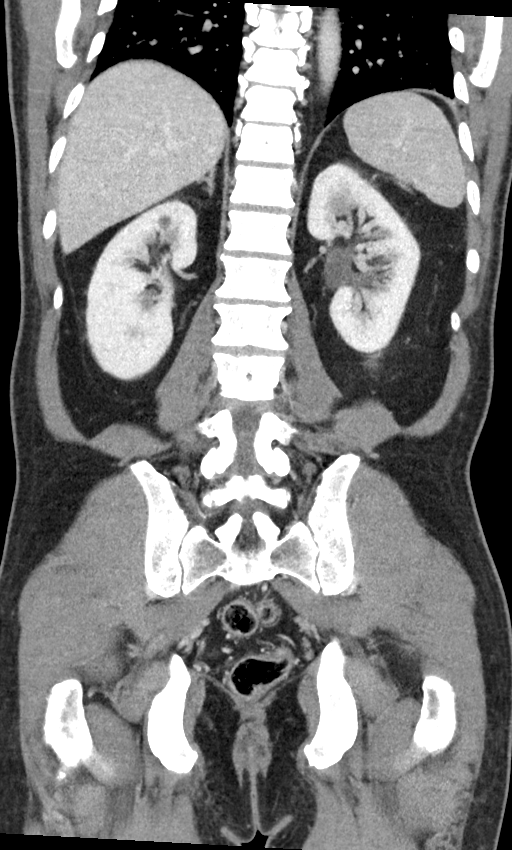
[im 31/69  soft-tissue]
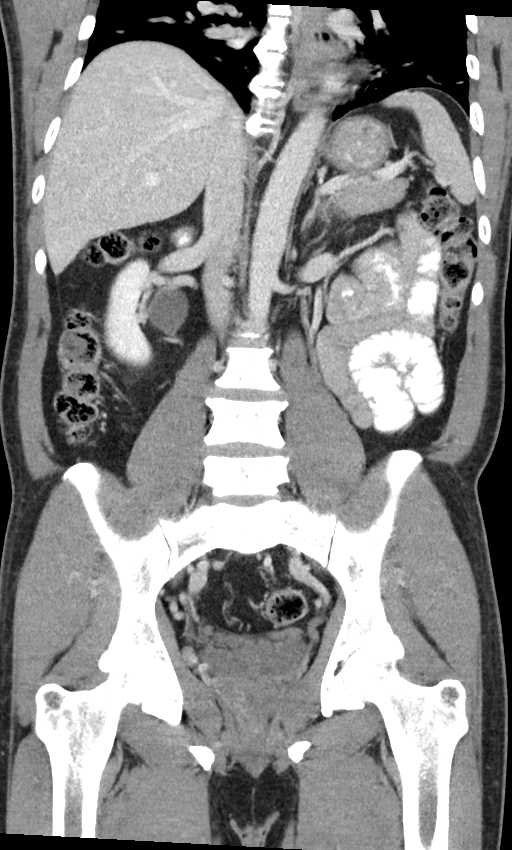
[im 38/69  soft-tissue]
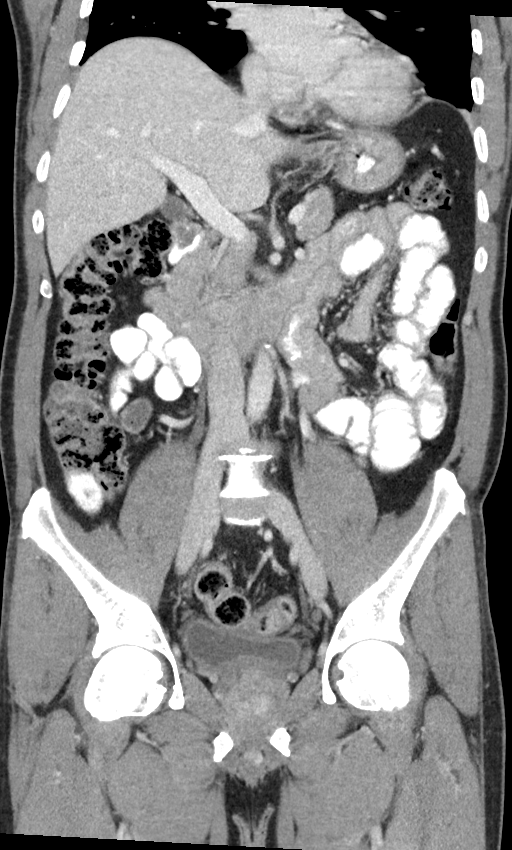

[14 of 46 positions shown; findings below may reference images not displayed]

FINDINGS: CT CHEST FINDINGS

Cardiovascular: No IV contrast. No significant vascular findings.
Normal heart size. No pericardial effusion.

Mediastinum/Nodes: No axillary or supraclavicular adenopathy. No
mediastinal or hilar adenopathy. No pericardial fluid. Esophagus
normal.

Lungs/Pleura: Subpleural nodule in the RIGHT lower lobe measures 3
mm (image 73/3). A thin walled cystic lesion at the RIGHT lung base
measures 25 mm.

Musculoskeletal: No aggressive osseous lesion.

CT ABDOMEN AND PELVIS FINDINGS

Hepatobiliary: No focal hepatic lesion. No biliary ductal
dilatation. Gallbladder is normal. Common bile duct is normal.

Pancreas: Pancreas is normal. No ductal dilatation. No pancreatic
inflammation.

Spleen: Normal spleen

Adrenals/urinary tract: Adrenal glands and kidneys are normal. The
ureters and bladder normal.

Stomach/Bowel: Stomach, small bowel, appendix, and cecum are normal.
The colon and rectosigmoid colon are normal.

Vascular/Lymphatic: Abdominal aorta is normal caliber. There is no
retroperitoneal or periportal lymphadenopathy. No pelvic
lymphadenopathy.

Reproductive: Prostate unremarkable.

Other: No free fluid.

Musculoskeletal: No aggressive osseous lesion.
IMPRESSION: Chest Impression:

1. Small RIGHT lobe pulmonary nodule. No follow-up needed if patient
is low-risk. Non-contrast chest CT can be considered in 12 months if
patient is high-risk. This recommendation follows the consensus
statement: Guidelines for Management of Incidental Pulmonary Nodules
Detected on CT Images: From the [HOSPITAL] 6472; Radiology
6472; [DATE].
2. Thin walled cystic lesion at the RIGHT lung base appears benign.

Abdomen / Pelvis Impression:

1. No explanation for weight loss. No evidence of malignancy in the
abdomen pelvis.
2. No acute abdominopelvic findings.

## 2021-10-02 ENCOUNTER — Other Ambulatory Visit: Payer: Self-pay | Admitting: Osteopathic Medicine

## 2021-10-02 DIAGNOSIS — I10 Essential (primary) hypertension: Secondary | ICD-10-CM

## 2021-11-01 ENCOUNTER — Other Ambulatory Visit: Payer: Self-pay | Admitting: Medical-Surgical

## 2021-11-01 DIAGNOSIS — I10 Essential (primary) hypertension: Secondary | ICD-10-CM

## 2021-11-10 ENCOUNTER — Other Ambulatory Visit: Payer: Self-pay | Admitting: Neurology

## 2021-11-10 DIAGNOSIS — I10 Essential (primary) hypertension: Secondary | ICD-10-CM

## 2021-11-10 MED ORDER — LOSARTAN POTASSIUM 25 MG PO TABS: 25.0000 mg | ORAL_TABLET | Freq: Every day | ORAL | 0 refills | Status: DC

## 2021-11-14 ENCOUNTER — Other Ambulatory Visit: Payer: Self-pay

## 2021-11-14 DIAGNOSIS — I10 Essential (primary) hypertension: Secondary | ICD-10-CM

## 2021-11-14 MED ORDER — LOSARTAN POTASSIUM 25 MG PO TABS: 25.0000 mg | ORAL_TABLET | Freq: Every day | ORAL | 0 refills | Status: AC

## 2022-04-08 ENCOUNTER — Telehealth: Payer: Self-pay | Admitting: Medical-Surgical

## 2022-04-08 DIAGNOSIS — I10 Essential (primary) hypertension: Secondary | ICD-10-CM

## 2022-04-13 NOTE — Telephone Encounter (Signed)
Patient needs TOC appointment for further refills.  Refills refused.

## 2022-04-17 NOTE — Telephone Encounter (Signed)
Patient needs to transfer care to a new PCP. Please schedule accordingly. Thanks!

## 2022-04-17 NOTE — Telephone Encounter (Signed)
LVM for the patient to call and schedule and appt to get a med refill- tvt

## 2022-04-17 NOTE — Telephone Encounter (Signed)
Pt has already est care somewhere else.  Tvt

## 2022-06-25 ENCOUNTER — Other Ambulatory Visit: Payer: Self-pay | Admitting: Medical-Surgical

## 2022-06-25 DIAGNOSIS — I10 Essential (primary) hypertension: Secondary | ICD-10-CM

## 2023-09-11 ENCOUNTER — Encounter: Payer: Self-pay | Admitting: Physician Assistant

## 2023-10-18 ENCOUNTER — Encounter: Payer: Self-pay | Admitting: Physician Assistant

## 2023-10-18 ENCOUNTER — Ambulatory Visit: Payer: Self-pay | Admitting: Physician Assistant

## 2023-10-18 VITALS — BP 116/70 | HR 94 | Ht 67.0 in | Wt 133.8 lb

## 2023-10-18 DIAGNOSIS — Z860101 Personal history of adenomatous and serrated colon polyps: Secondary | ICD-10-CM | POA: Diagnosis not present

## 2023-10-18 DIAGNOSIS — K5900 Constipation, unspecified: Secondary | ICD-10-CM | POA: Diagnosis not present

## 2023-10-18 MED ORDER — NA SULFATE-K SULFATE-MG SULF 17.5-3.13-1.6 GM/177ML PO SOLN: 1.0000 | Freq: Once | ORAL | 0 refills | Status: AC

## 2023-10-18 NOTE — Progress Notes (Signed)
 Chief Complaint: Discuss colonoscopy, black stool and abdominal cramping  HPI:    Nicholas Salazar is a 63 year old male, known to Dr. Barron Alvine, with a past medical history as listed below including diabetes, who resents to clinic today to discuss black stools and abdominal cramping.    12/02/2020 colonoscopy for personal history of adenomatous polyps with 3 to-4 mm polyps in the descending colon, transverse colon hepatic flexure as well as diverticulosis in the sigmoid, transverse and ascending colon and cecum.  Nonbleeding internal hemorrhoids.  Pathology showed tubular adenoma and repeat recommended in 3 years.    Today, the patient presents to clinic and tells me he is having some issues with constipation.  Sometimes he will wake up in the morning and have some cramping at his bellybutton or just above at around 3 or 4:00 in the morning and this will typically result in a stool.  If he has a bowel movement this pain goes away.  Sometimes he cannot do by himself so he has to drink a glass of warm water in order to have a bowel movement and then the pain goes away.  The pain is definitely associated with a bowel movement.  Tells me very occasionally sees a black stool but this is only when he eats black foods.  He is not concerned about this and has no other upper GI symptoms.  Tells me he wants to gain about 10 pounds and is asking how to do that.    Denies fever, chills, weight loss, blood in his stool, nausea, vomiting or symptoms that awaken him from sleep.  Past Medical History:  Diagnosis Date   Diabetes mellitus (HCC)    Hyperlipidemia    Hypertension     Past Surgical History:  Procedure Laterality Date   COLONOSCOPY  2014    Current Outpatient Medications  Medication Sig Dispense Refill   blood glucose meter kit and supplies KIT Dispense based on patient and insurance preference. Use daily as directed. Please include lancets, test strips, control solution. 1 each 1    Dapagliflozin-metFORMIN HCl ER (XIGDUO XR) 12-998 MG TB24 Take 1 tablet by mouth daily. NEEDS APPT FOR FURTHER REFILLS 30 tablet 0   losartan (COZAAR) 25 MG tablet Take 1 tablet (25 mg total) by mouth daily. NO REFILLS. NEEDS TO TRANSFER CARE TO NEW PCP. 7 tablet 0   ONETOUCH DELICA LANCETS 33G MISC USE ONE LANCTES TO CHECK BLOOD SUGAR ONCE DAILY AS DIRECTED 100 each 1   No current facility-administered medications for this visit.    Allergies as of 10/18/2023 - Review Complete 12/02/2020  Allergen Reaction Noted   Lisinopril Cough 03/11/2019    Family History  Problem Relation Age of Onset   Colon cancer Neg Hx    Colon polyps Neg Hx    Esophageal cancer Neg Hx    Rectal cancer Neg Hx    Stomach cancer Neg Hx     Social History   Socioeconomic History   Marital status: Married    Spouse name: Not on file   Number of children: Not on file   Years of education: Not on file   Highest education level: Not on file  Occupational History   Occupation: bus driver    Employer: [employer]  Tobacco Use   Smoking status: Never   Smokeless tobacco: Never  Vaping Use   Vaping status: Never Used  Substance and Sexual Activity   Alcohol use: Yes    Alcohol/week: 1.0 standard drink of  alcohol    Types: 1 Standard drinks or equivalent per week   Drug use: No   Sexual activity: Not Currently  Other Topics Concern   Not on file  Social History Narrative   Not on file   Social Drivers of Health   Financial Resource Strain: Not on file  Food Insecurity: Not on file  Transportation Needs: Not on file  Physical Activity: Not on file  Stress: Not on file  Social Connections: Not on file  Intimate Partner Violence: Not on file    Review of Systems:    Constitutional: No weight loss, fever or chills Cardiovascular: No chest pain Respiratory: No SOB Gastrointestinal: See HPI and otherwise negative   Physical Exam:  Vital signs: BP 116/70 (BP Location: Left Arm, Patient  Position: Sitting, Cuff Size: Normal)   Pulse 94   Ht 5\' 7"  (1.702 m)   Wt 133 lb 12.8 oz (60.7 kg)   BMI 20.96 kg/m    Constitutional:   Pleasant male appears to be in NAD, Well developed, Well nourished, alert and cooperative Respiratory: Respirations even and unlabored. Lungs clear to auscultation bilaterally.   No wheezes, crackles, or rhonchi.  Cardiovascular: Normal S1, S2. No MRG. Regular rate and rhythm. No peripheral edema, cyanosis or pallor.  Gastrointestinal:  Soft, nondistended, nontender. No rebound or guarding. Normal bowel sounds. No appreciable masses or hepatomegaly. Rectal:  Not performed.  Psychiatric: Oriented to person, place and time. Demonstrates good judgement and reason without abnormal affect or behaviors.  RELEVANT LABS AND IMAGING: CBC    Component Value Date/Time   WBC 6.7 11/30/2020 1040   RBC 5.44 11/30/2020 1040   HGB 17.0 11/30/2020 1040   HCT 50.7 (H) 11/30/2020 1040   PLT 359 11/30/2020 1040   MCV 93.2 11/30/2020 1040   MCH 31.3 11/30/2020 1040   MCHC 33.5 11/30/2020 1040   RDW 13.0 11/30/2020 1040   LYMPHSABS 1,680 05/18/2016 1015   MONOABS 672 05/18/2016 1015   EOSABS 84 05/18/2016 1015   BASOSABS 0 05/18/2016 1015    CMP     Component Value Date/Time   NA 137 11/30/2020 1040   K 5.0 11/30/2020 1040   CL 99 11/30/2020 1040   CO2 27 11/30/2020 1040   GLUCOSE 117 (H) 11/30/2020 1040   BUN 11 11/30/2020 1040   CREATININE 0.90 11/30/2020 1040   CALCIUM 9.6 11/30/2020 1040   PROT 7.6 11/30/2020 1040   ALBUMIN 4.3 11/12/2016 1016   AST 22 11/30/2020 1040   ALT 17 11/30/2020 1040   ALKPHOS 74 11/12/2016 1016   BILITOT 0.9 11/30/2020 1040   GFRNONAA 93 11/30/2020 1040   GFRAA 107 11/30/2020 1040    Assessment: 1.  Constipation: Change to constipation over the past few months, this is helped by warm water, but he has not tried anything else, associated symptoms include abdominal cramping 2.  History of adenomatous polyps: Last  colonoscopy in April 2022, repeat recommended 3 years, patient is due now  Plan: 1.  Recommend the patient start MiraLAX on a daily basis.  We did discuss the safety of this medication and its proper use.  Explained that he can use it up to 4 times if necessary. 2.  Discussed how we could possibly put on some weight, suggested a Premier protein or other protein shake to supplement what he is already eating. 3.  Discussed black stools, likely disease are a direct result of what he is eating.  He has no other epigastric symptoms and  is not concerned about this. 4.  Patient will have a 2-day bowel prep for, and colonoscopy. 5.  Patient is scheduled for a surveillance colonoscopy given history of adenomatous polyps in the [facility].  This was scheduled with Dr. Meridee Score because the patient wanted during spring break. 6.  Patient will continue to follow with Dr. Barron Alvine with his primary GI physician here after procedure.  He will follow in clinic per recommendations.  Nicholas Meeker, PA-C [facility] 10/18/2023, 10:20 AM

## 2023-10-18 NOTE — Patient Instructions (Addendum)
 Start Miralax 1 capful daily in 8 ounces of liquid.  Try Premier Protein Shakes   You have been scheduled for a colonoscopy. Please follow written instructions given to you at your visit today.   If you use inhalers (even only as needed), please bring them with you on the day of your procedure.  DO NOT TAKE 7 DAYS PRIOR TO TEST- Trulicity (dulaglutide) Ozempic, Wegovy (semaglutide) Mounjaro (tirzepatide) Bydureon Bcise (exanatide extended release)  DO NOT TAKE 1 DAY PRIOR TO YOUR TEST Rybelsus (semaglutide) Adlyxin (lixisenatide) Victoza (liraglutide) Byetta (exanatide) _______________________________________________________  If your blood pressure at your visit was 140/90 or greater, please contact your primary care physician to follow up on this.  _______________________________________________________  If you are age 80 or older, your body mass index should be between 23-30. Your Body mass index is 20.96 kg/m. If this is out of the aforementioned range listed, please consider follow up with your Primary Care Provider.  If you are age 31 or younger, your body mass index should be between 19-25. Your Body mass index is 20.96 kg/m. If this is out of the aformentioned range listed, please consider follow up with your Primary Care Provider.   ________________________________________________________  The [facility] providers would like to encourage you to use [facility] to communicate with providers for non-urgent requests or questions.  Due to long hold times on the telephone, sending your provider a message by [facility] may be a faster and more efficient way to get a response.  Please allow 48 business hours for a response.  Please remember that this is for non-urgent requests.  _______________________________________________________

## 2023-10-18 NOTE — Progress Notes (Signed)
 Agree with the assessment and plan as outlined by Hyacinth Meeker, PA-C. ? ?Orman Matsumura, DO, FACG ? ?

## 2023-10-20 NOTE — Progress Notes (Signed)
 Happy to be available.

## 2023-11-19 ENCOUNTER — Encounter: Payer: Self-pay | Admitting: Gastroenterology

## 2023-11-24 ENCOUNTER — Encounter: Payer: Self-pay | Admitting: Certified Registered Nurse Anesthetist

## 2023-11-27 ENCOUNTER — Ambulatory Visit (AMBULATORY_SURGERY_CENTER): Admitting: Gastroenterology

## 2023-11-27 ENCOUNTER — Encounter: Payer: Self-pay | Admitting: Gastroenterology

## 2023-11-27 VITALS — BP 109/74 | HR 66 | Temp 97.2°F | Resp 17 | Ht 67.0 in | Wt 133.0 lb

## 2023-11-27 DIAGNOSIS — K641 Second degree hemorrhoids: Secondary | ICD-10-CM

## 2023-11-27 DIAGNOSIS — Z860101 Personal history of adenomatous and serrated colon polyps: Secondary | ICD-10-CM

## 2023-11-27 DIAGNOSIS — K5730 Diverticulosis of large intestine without perforation or abscess without bleeding: Secondary | ICD-10-CM | POA: Diagnosis not present

## 2023-11-27 DIAGNOSIS — Z1211 Encounter for screening for malignant neoplasm of colon: Secondary | ICD-10-CM | POA: Diagnosis present

## 2023-11-27 DIAGNOSIS — K5900 Constipation, unspecified: Secondary | ICD-10-CM

## 2023-11-27 MED ORDER — SODIUM CHLORIDE 0.9 % IV SOLN: 500.0000 mL | INTRAVENOUS | Status: DC

## 2023-11-27 NOTE — Progress Notes (Signed)
 GASTROENTEROLOGY PROCEDURE H&P NOTE   Primary Care Physician: System, Provider Not In  HPI: Nicholas Salazar is a 63 y.o. male who presents for Colonoscopy for surveillance of prior adenomas.  Past Medical History:  Diagnosis Date   Diabetes mellitus (HCC)    Hyperlipidemia    Hypertension    Past Surgical History:  Procedure Laterality Date   COLONOSCOPY  2014   Current Outpatient Medications  Medication Sig Dispense Refill   atorvastatin (LIPITOR) 10 MG tablet Take 10 mg by mouth daily.     blood glucose meter kit and supplies KIT Dispense based on patient and insurance preference. Use daily as directed. Please include lancets, test strips, control solution. 1 each 1   Dapagliflozin-metFORMIN HCl ER (XIGDUO XR) 12-998 MG TB24 Take 1 tablet by mouth daily. NEEDS APPT FOR FURTHER REFILLS 30 tablet 0   losartan (COZAAR) 25 MG tablet Take 1 tablet (25 mg total) by mouth daily. NO REFILLS. NEEDS TO TRANSFER CARE TO NEW PCP. 7 tablet 0   ONETOUCH DELICA LANCETS 33G MISC USE ONE LANCTES TO CHECK BLOOD SUGAR ONCE DAILY AS DIRECTED 100 each 1   No current facility-administered medications for this visit.    Current Outpatient Medications:    atorvastatin (LIPITOR) 10 MG tablet, Take 10 mg by mouth daily., Disp: , Rfl:    blood glucose meter kit and supplies KIT, Dispense based on patient and insurance preference. Use daily as directed. Please include lancets, test strips, control solution., Disp: 1 each, Rfl: 1   Dapagliflozin-metFORMIN HCl ER (XIGDUO XR) 12-998 MG TB24, Take 1 tablet by mouth daily. NEEDS APPT FOR FURTHER REFILLS, Disp: 30 tablet, Rfl: 0   losartan (COZAAR) 25 MG tablet, Take 1 tablet (25 mg total) by mouth daily. NO REFILLS. NEEDS TO TRANSFER CARE TO NEW PCP., Disp: 7 tablet, Rfl: 0   ONETOUCH DELICA LANCETS 33G MISC, USE ONE LANCTES TO CHECK BLOOD SUGAR ONCE DAILY AS DIRECTED, Disp: 100 each, Rfl: 1 Allergies  Allergen Reactions   Lisinopril Cough   Family History   Problem Relation Age of Onset   Colon cancer Neg Hx    Colon polyps Neg Hx    Esophageal cancer Neg Hx    Rectal cancer Neg Hx    Stomach cancer Neg Hx    Social History   Socioeconomic History   Marital status: Married    Spouse name: Not on file   Number of children: 2   Years of education: Not on file   Highest education level: Not on file  Occupational History   Occupation: bus driver    Employer: [employer]  Tobacco Use   Smoking status: Never   Smokeless tobacco: Never  Vaping Use   Vaping status: Never Used  Substance and Sexual Activity   Alcohol use: Yes    Alcohol/week: 1.0 standard drink of alcohol    Types: 1 Standard drinks or equivalent per week   Drug use: No   Sexual activity: Not Currently  Other Topics Concern   Not on file  Social History Narrative   Not on file   Social Drivers of Health   Financial Resource Strain: Not on file  Food Insecurity: Not on file  Transportation Needs: Not on file  Physical Activity: Not on file  Stress: Not on file  Social Connections: Not on file  Intimate Partner Violence: Not on file    Physical Exam: There were no vitals filed for this visit. There is no height or weight  on file to calculate BMI. GEN: NAD EYE: Sclerae anicteric ENT: MMM CV: Non-tachycardic GI: Soft, NT/ND NEURO:  Alert & Oriented x 3  Lab Results: No results for input(s): "WBC", "HGB", "HCT", "PLT" in the last 72 hours. BMET No results for input(s): "NA", "K", "CL", "CO2", "GLUCOSE", "BUN", "CREATININE", "CALCIUM" in the last 72 hours. LFT No results for input(s): "PROT", "ALBUMIN", "AST", "ALT", "ALKPHOS", "BILITOT", "BILIDIR", "IBILI" in the last 72 hours. PT/INR No results for input(s): "LABPROT", "INR" in the last 72 hours.   Impression / Plan: This is a 63 y.o.male who presents for Colonoscopy for surveillance of prior adenomas.   The risks and benefits of endoscopic evaluation/treatment were discussed with the patient  and/or family; these include but are not limited to the risk of perforation, infection, bleeding, missed lesions, lack of diagnosis, severe illness requiring hospitalization, as well as anesthesia and sedation related illnesses.  The patient's history has been reviewed, patient examined, no change in status, and deemed stable for procedure.  The patient and/or family is agreeable to proceed.    Nicholas Henle, MD [facility] Advanced Endoscopy Office # [phone]

## 2023-11-27 NOTE — Progress Notes (Signed)
 Report given to PACU, vss

## 2023-11-27 NOTE — Patient Instructions (Addendum)
 Please read handouts provided. Clear liquid diet today for repeat colonoscopy for tomorrow 11/28/2023. Continue present medications.  YOU HAD AN ENDOSCOPIC PROCEDURE TODAY AT THE [facility]:   Refer to the procedure report that was given to you for any specific questions about what was found during the examination.  If the procedure report does not answer your questions, please call your gastroenterologist to clarify.  If you requested that your care partner not be given the details of your procedure findings, then the procedure report has been included in a sealed envelope for you to review at your convenience later.  YOU SHOULD EXPECT: Some feelings of bloating in the abdomen. Passage of more gas than usual.  Walking can help get rid of the air that was put into your GI tract during the procedure and reduce the bloating. If you had a lower endoscopy (such as a colonoscopy or flexible sigmoidoscopy) you may notice spotting of blood in your stool or on the toilet paper. If you underwent a bowel prep for your procedure, you may not have a normal bowel movement for a few days.  Please Note:  You might notice some irritation and congestion in your nose or some drainage.  This is from the oxygen used during your procedure.  There is no need for concern and it should clear up in a day or so.  SYMPTOMS TO REPORT IMMEDIATELY:  Following lower endoscopy (colonoscopy or flexible sigmoidoscopy):  Excessive amounts of blood in the stool  Significant tenderness or worsening of abdominal pains  Swelling of the abdomen that is new, acute  Fever of 100F or higher.  For urgent or emergent issues, a gastroenterologist can be reached at any hour by calling [phone]. Do not use MyChart messaging for urgent concerns.    DIET:   Drink plenty of fluids but you should avoid alcoholic beverages for 24 hours.  ACTIVITY:  You should plan to take it easy for the rest of today and you should NOT  DRIVE or use heavy machinery until tomorrow (because of the sedation medicines used during the test).    FOLLOW UP: Our staff will call the number listed on your records the next business day following your procedure.  We will call around 7:15- 8:00 am to check on you and address any questions or concerns that you may have regarding the information given to you following your procedure. If we do not reach you, we will leave a message.     If any biopsies were taken you will be contacted by phone or by letter within the next 1-3 weeks.  Please call us  at [phone] if you have not heard about the biopsies in 3 weeks.    SIGNATURES/CONFIDENTIALITY: You and/or your care partner have signed paperwork which will be entered into your electronic medical record.  These signatures attest to the fact that that the information above on your After Visit Summary has been reviewed and is understood.  Full responsibility of the confidentiality of this discharge information lies with you and/or your care-partner.

## 2023-11-27 NOTE — Progress Notes (Signed)
 Pt's states no medical or surgical changes since previsit or office visit.

## 2023-11-27 NOTE — Op Note (Signed)
  [facility] Patient Name: Nicholas Salazar Procedure Date: 11/27/2023 8:43 AM MRN: [id number] Endoscopist: Corliss Parish , MD, [id number] Age: 63 Referring MD:  Date of Birth: 01-15-1961 Gender: Male Account #: [id number] Procedure:                Colonoscopy Indications:              Surveillance: Personal history of adenomatous                            polyps on last colonoscopy 3 years ago Medicines:                Monitored Anesthesia Care Procedure:                Pre-Anesthesia Assessment:                           - Prior to the procedure, a History and Physical                            was performed, and patient medications and                            allergies were reviewed. The patient's tolerance of                            previous anesthesia was also reviewed. The risks                            and benefits of the procedure and the sedation                            options and risks were discussed with the patient.                            All questions were answered, and informed consent                            was obtained. Prior Anticoagulants: The patient has                            taken no anticoagulant or antiplatelet agents. ASA                            Grade Assessment: II - A patient with mild systemic                            disease. After reviewing the risks and benefits,                            the patient was deemed in satisfactory condition to                            undergo the procedure.  After obtaining informed consent, the colonoscope                            was passed under direct vision. Throughout the                            procedure, the patient's blood pressure, pulse, and                            oxygen saturations were monitored continuously. The                            CF [id number] #[id number] was introduced through the anus                            and advanced to the  the cecum, identified by                            appendiceal orifice and ileocecal valve. The                            colonoscopy was performed without difficulty. The                            patient tolerated the procedure. The quality of the                            bowel preparation was inadequate. The ileocecal                            valve, appendiceal orifice, and rectum were                            photographed. Scope In: 8:54:16 AM Scope Out: 9:03:18 AM Scope Withdrawal Time: 0 hours 6 minutes 51 seconds  Total Procedure Duration: 0 hours 9 minutes 2 seconds  Findings:                 The digital rectal exam findings include                            hemorrhoids. Pertinent negatives include no                            palpable rectal lesions.                           Extensive amounts of semi-liquid semi-solid stool                            was found in the entire colon, making visualization                            difficult. Lavage of the area was performed using  copious amounts, resulting in incomplete clearance                            with incomplete but fair visualization.                           Multiple small-mouthed diverticula were found in                            the entire colon.                           Non-bleeding non-thrombosed internal hemorrhoids                            were found during retroflexion, during perianal                            exam and during digital exam. The hemorrhoids were                            Grade II (internal hemorrhoids that prolapse but                            reduce spontaneously). Complications:            No immediate complications. Estimated Blood Loss:     Estimated blood loss: none. Impression:               - Stool in the entire examined colon. Preparation                            of the colon was inadequate even after extensive                             lavage.                           - Hemorrhoids found on digital rectal exam.                           - Diverticulosis in the entire examined colon.                           - Non-bleeding non-thrombosed internal hemorrhoids. Recommendation:           - The patient will be observed post-procedure,                            until all discharge criteria are met.                           - Discharge patient to home.                           - Patient has a contact number available for  emergencies. The signs and symptoms of potential                            delayed complications were discussed with the                            patient. Return to normal activities tomorrow.                            Written discharge instructions were provided to the                            patient.                           - Resume previous diet.                           - Continue present medications.                           - Repeat colonoscopy within next 6-12 months for                            true surveillance due to inadequate preparation.                            Can see if patient may be able to have procedure                            done this week with another colleague if it works                            with his schedule.                           - The findings and recommendations were discussed                            with the patient.                           - The findings and recommendations were discussed                            with the designated responsible adult. Yong Henle, MD 11/27/2023 9:08:54 AM

## 2023-11-28 ENCOUNTER — Ambulatory Visit: Admitting: Gastroenterology

## 2023-11-28 ENCOUNTER — Encounter: Payer: Self-pay | Admitting: Gastroenterology

## 2023-11-28 VITALS — BP 127/84 | HR 67 | Temp 97.7°F | Resp 15 | Ht 67.0 in | Wt 133.0 lb

## 2023-11-28 DIAGNOSIS — K648 Other hemorrhoids: Secondary | ICD-10-CM

## 2023-11-28 DIAGNOSIS — K5730 Diverticulosis of large intestine without perforation or abscess without bleeding: Secondary | ICD-10-CM

## 2023-11-28 DIAGNOSIS — Z1211 Encounter for screening for malignant neoplasm of colon: Secondary | ICD-10-CM | POA: Diagnosis present

## 2023-11-28 DIAGNOSIS — Z860101 Personal history of adenomatous and serrated colon polyps: Secondary | ICD-10-CM

## 2023-11-28 MED ORDER — SODIUM CHLORIDE 0.9 % IV SOLN: 500.0000 mL | INTRAVENOUS | Status: DC

## 2023-11-28 NOTE — Op Note (Signed)
 [facility] Patient Name: Nicholas Salazar Procedure Date: 11/28/2023 10:19 AM MRN: [id number] Endoscopist: Sherilyn Cooter L. Myrtie Neither , MD, [id number] Age: 63 Referring MD:  Date of Birth: 17-Jul-1961 Gender: Male Account #: [id number] Procedure:                Colonoscopy Indications:              Surveillance: Personal history of adenomatous                            polyps on last colonoscopy 3 years ago                           incidental constipation noted and described in                            recent office note                           three SubCM TA Apr 2022 subCM TA 2014                           Poor prep (with 2 day plan) yesterday. took more                            prep before today's exam Medicines:                Monitored Anesthesia Care Procedure:                Pre-Anesthesia Assessment:                           - Prior to the procedure, a History and Physical                            was performed, and patient medications and                            allergies were reviewed. The patient's tolerance of                            previous anesthesia was also reviewed. The risks                            and benefits of the procedure and the sedation                            options and risks were discussed with the patient.                            All questions were answered, and informed consent                            was obtained. Prior Anticoagulants: The patient has  taken no anticoagulant or antiplatelet agents. ASA                            Grade Assessment: II - A patient with mild systemic                            disease. After reviewing the risks and benefits,                            the patient was deemed in satisfactory condition to                            undergo the procedure.                           After obtaining informed consent, the colonoscope                            was passed under  direct vision. Throughout the                            procedure, the patient's blood pressure, pulse, and                            oxygen saturations were monitored continuously. The                            CF [id number] #[id number] was introduced through the anus                            and advanced to the the cecum, identified by                            appendiceal orifice and ileocecal valve. The                            colonoscopy was performed without difficulty. The                            patient tolerated the procedure well. The quality                            of the bowel preparation was excellent. The                            ileocecal valve, appendiceal orifice, and rectum                            were photographed. Scope In: 10:30:03 AM Scope Out: 10:46:09 AM Scope Withdrawal Time: 0 hours 12 minutes 39 seconds  Total Procedure Duration: 0 hours 16 minutes 6 seconds  Findings:                 The perianal and digital rectal examinations were  normal.                           Repeat examination of right colon under NBI                            performed.                           Multiple diverticula were found in the entire colon.                           Internal hemorrhoids were found. The hemorrhoids                            were medium-sized.                           The exam was otherwise without abnormality on                            direct and retroflexion views. Complications:            No immediate complications. Estimated Blood Loss:     Estimated blood loss: none. Impression:               - Diverticulosis in the entire examined colon.                           - Internal hemorrhoids.                           - The examination was otherwise normal on direct                            and retroflexion views.                           - No specimens collected. Recommendation:           - Patient has a  contact number available for                            emergencies. The signs and symptoms of potential                            delayed complications were discussed with the                            patient. Return to normal activities tomorrow.                            Written discharge instructions were provided to the                            patient.                           -  Resume previous diet.                           - Continue present medications.                           - Repeat colonoscopy in 7 years for surveillance.                           - Follow up in the office with Dr. Karene Oto for                            management of your constipation. Brizza Nathanson L. Dominic Friendly, MD 11/28/2023 10:51:03 AM This report has been signed electronically.

## 2023-11-28 NOTE — Progress Notes (Signed)
 Report given to PACU, vss

## 2023-11-28 NOTE — Progress Notes (Signed)
 Pt's states no medical or surgical changes since previsit or office visit.

## 2023-11-28 NOTE — Progress Notes (Signed)
 History and Physical:  This patient presents for endoscopic testing for: Encounter Diagnoses  Name Primary?   History of adenomatous polyp of colon Yes   Constipation, unspecified constipation type     History as outlined in Dr. Marolyn Sis pre-procedure H+P yesterday. No clinical changes since then. Hx Constipation and adenomatous polyps. Poor prep on yesterday's colonoscopy (having had 2 day prep).  Took more prep for today's exam  Patient is otherwise without complaints or active issues today.   Past Medical History: Past Medical History:  Diagnosis Date   Allergy    Diabetes mellitus (HCC)    Hyperlipidemia    Hypertension      Past Surgical History: Past Surgical History:  Procedure Laterality Date   COLONOSCOPY  2014    Allergies: Allergies  Allergen Reactions   Lisinopril Cough    Outpatient Meds: Current Outpatient Medications  Medication Sig Dispense Refill   atorvastatin (LIPITOR) 10 MG tablet Take 10 mg by mouth daily.     blood glucose meter kit and supplies KIT Dispense based on patient and insurance preference. Use daily as directed. Please include lancets, test strips, control solution. 1 each 1   Dapagliflozin-metFORMIN HCl ER (XIGDUO XR) 12-998 MG TB24 Take 1 tablet by mouth daily. NEEDS APPT FOR FURTHER REFILLS 30 tablet 0   losartan (COZAAR) 25 MG tablet Take 1 tablet (25 mg total) by mouth daily. NO REFILLS. NEEDS TO TRANSFER CARE TO NEW PCP. 7 tablet 0   ONETOUCH DELICA LANCETS 33G MISC USE ONE LANCTES TO CHECK BLOOD SUGAR ONCE DAILY AS DIRECTED 100 each 1   Current Facility-Administered Medications  Medication Dose Route Frequency Provider Last Rate Last Admin   0.9 %  sodium chloride infusion  500 mL Intravenous Continuous Danis, Roel Clarity III, MD          ___________________________________________________________________ Objective   Exam:  BP 138/75   Pulse 70   Temp 97.7 F (36.5 C) (Temporal)   Ht 5\' 7"  (1.702 m)   Wt 133 lb  (60.3 kg)   SpO2 99%   BMI 20.83 kg/m   CV: regular , S1/S2 Resp: clear to auscultation bilaterally, normal RR and effort noted GI: soft, no tenderness, with active bowel sounds.   Assessment: Encounter Diagnoses  Name Primary?   History of adenomatous polyp of colon Yes   Constipation, unspecified constipation type      Plan: Colonoscopy   The benefits and risks of the planned procedure(s) were described in detail with the patient or (when appropriate) their health care proxy.  Risks were outlined as including, but not limited to, bleeding, infection, perforation, adverse medication reaction leading to cardiac or pulmonary decompensation, pancreatitis (if ERCP).  The limitation of incomplete mucosal visualization was also discussed.  No guarantees or warranties were given.  The patient is appropriate for an endoscopic procedure in the ambulatory setting.   - Lorella Roles, MD

## 2023-11-28 NOTE — Patient Instructions (Signed)

## 2023-12-02 ENCOUNTER — Telehealth: Payer: Self-pay

## 2023-12-02 NOTE — Telephone Encounter (Signed)
 Left message on answering machine.

## 2023-12-04 ENCOUNTER — Telehealth: Payer: Self-pay

## 2023-12-04 NOTE — Telephone Encounter (Signed)
 I left him a detailed message per Dr Dominic Friendly to please call us  and set up a follow up appointment with Dr Karene Oto for constipation management.

## 2023-12-31 NOTE — Telephone Encounter (Signed)
 I left him a detailed message to please call for an appointment with Dr Harry Lindau. We now have openings in August.

## 2024-01-17 NOTE — Telephone Encounter (Signed)
 Unable to reach him by phone so I have mailed him a letter to please call and set up a f/u appointment with Dr Karene Oto for management of his constipation.

## 2024-05-22 ENCOUNTER — Ambulatory Visit (HOSPITAL_BASED_OUTPATIENT_CLINIC_OR_DEPARTMENT_OTHER): Admitting: Student

## 2024-05-22 ENCOUNTER — Ambulatory Visit (HOSPITAL_BASED_OUTPATIENT_CLINIC_OR_DEPARTMENT_OTHER)

## 2024-05-22 DIAGNOSIS — M7711 Lateral epicondylitis, right elbow: Secondary | ICD-10-CM

## 2024-05-22 DIAGNOSIS — M25521 Pain in right elbow: Secondary | ICD-10-CM | POA: Diagnosis not present

## 2024-05-22 MED ORDER — TRIAMCINOLONE ACETONIDE 40 MG/ML IJ SUSP
2.0000 mL | INTRAMUSCULAR | Status: AC | PRN
  Administered 2024-05-22: 2 mL via INTRA_ARTICULAR

## 2024-05-22 MED ORDER — LIDOCAINE HCL 1 % IJ SOLN
2.0000 mL | INTRAMUSCULAR | Status: AC | PRN
  Administered 2024-05-22: 2 mL

## 2024-05-22 NOTE — Progress Notes (Signed)
 Chief Complaint: Right elbow pain    Discussed the use of AI scribe software for clinical note transcription with the patient, who gave verbal consent to proceed.  History of Present Illness Nicholas Salazar is a 63 year old male who presents with chronic right elbow pain.  He has experienced right elbow pain for approximately one year. The pain, initially mild, has progressively worsened, originating on the lateral aspect of the elbow and sometimes radiating medially. It is exacerbated by movements such as lifting objects or pulling, occasionally becoming severe enough to prevent him from lifting a glass. 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  He was evaluated in [place] and was given a counterforce brace as well as diclofenac  gel with minimal relief. Bengay provides slight relief. The pain is localized to the elbow with no history of injury. He is concerned about the impact of the pain on his ability to continue working until his planned retirement next year.   Surgical History:   None  PMH/PSH/Family History/Social History/Meds/Allergies:    Past Medical History:  Diagnosis Date   Allergy    Diabetes mellitus (HCC)    Hyperlipidemia    Hypertension    Past Surgical History:  Procedure Laterality Date   COLONOSCOPY  2014   Social History   Socioeconomic History   Marital status: Married    Spouse name: Not on file   Number of children: 2   Years of education: Not on file   Highest education level: Not on file  Occupational History   Occupation: bus driver    Employer: [employer]  Tobacco Use   Smoking status: Never   Smokeless tobacco: Never  Vaping Use   Vaping status: Never Used  Substance and Sexual Activity   Alcohol use: Yes    Alcohol/week: 1.0 standard drink of alcohol    Types: 1 Standard drinks or  equivalent per week   Drug use: No   Sexual activity: Not Currently  Other Topics Concern   Not on file  Social History Narrative   Not on file   Social Drivers of Health   Financial Resource Strain: Not on file  Food Insecurity: Not on file  Transportation Needs: Not on file  Physical Activity: Not on file  Stress: Not on file  Social Connections: Not on file   Family History  Problem Relation Age of Onset   Colon cancer Neg Hx    Colon polyps Neg Hx    Esophageal cancer Neg Hx    Rectal cancer Neg Hx    Stomach cancer Neg Hx    Allergies  Allergen Reactions   Lisinopril  Cough   Current Outpatient Medications  Medication Sig Dispense Refill   atorvastatin (LIPITOR) 10 MG tablet Take 10 mg by mouth daily.     blood glucose meter kit and supplies KIT Dispense based on patient and insurance preference. Use daily as directed. Please include lancets, test strips, control solution. 1 each 1   Dapagliflozin -metFORMIN  HCl ER (XIGDUO  XR) 12-998 MG TB24 Take 1 tablet by mouth daily. NEEDS APPT FOR FURTHER REFILLS 30 tablet 0   losartan  (COZAAR ) 25 MG tablet Take 1 tablet (25 mg total) by mouth  daily. NO REFILLS. NEEDS TO TRANSFER CARE TO NEW PCP. 7 tablet 0   ONETOUCH DELICA LANCETS 33G MISC USE ONE LANCTES TO CHECK BLOOD SUGAR ONCE DAILY AS DIRECTED 100 each 1   No current facility-administered medications for this visit.   No results found.  Review of Systems:   A ROS was performed including pertinent positives and negatives as documented in the HPI.  Physical Exam :   Constitutional: NAD and appears stated age Neurological: Alert and oriented Psych: Appropriate affect and cooperative There were no vitals taken for this visit.   Comprehensive Musculoskeletal Exam:    Exam of the right elbow demonstrates point tenderness over the lateral epicondyle.  Pain is noted in this area with resisted wrist extension.  Full elbow range of motion from 0 to 130 degrees.  Able to fully  pronate supinate without pain.  Distal motor and neurosensory exam intact.  Imaging:   Xray (right elbow 3 views): Negative for bony abnormality   I personally reviewed and interpreted the radiographs.      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 Previous conservative treatments provided minimal relief. X-rays showed no significant arthritis or structural changes. The condition impacts work duties. Administered corticosteroid injection to the right lateral elbow tendon under ultrasound guidance. Continue use of forearm strap as needed. Consider job duty modification if symptoms persist. Reassess symptoms and discuss other treatment options if no improvement.     Procedure Note  Patient: Nicholas Salazar             Date of Birth: [birth year]           MRN: [id number]             Visit Date: 05/22/2024  Procedures: Visit Diagnoses:  1. Lateral epicondylitis, right elbow     Medium Joint Inj: R lateral epicondyle on 05/22/2024 12:41 PM Indications: pain Details: 25 G 1.5 in needle, lateral approach Medications: 2 mL lidocaine 1 %; 2 mL triamcinolone acetonide 40 MG/ML Outcome: tolerated well, no immediate complications Procedure, treatment alternatives, risks and benefits explained, specific risks discussed. Consent was given by the patient. Immediately prior to procedure a time out was called to verify the correct patient, procedure, equipment, support staff and site/side marked as required. Patient was prepped and draped in the usual sterile fashion.       I personally saw and evaluated the patient, and participated in the management and treatment plan.  Leonce Reveal, PA-C Orthopedics
